# Patient Record
Sex: Male | Born: 1990 | Race: Black or African American | Hispanic: No | Marital: Married | Smoking: Never smoker
Health system: Southern US, Community
[De-identification: ages and names within clinical notes are randomized; demographics above are authoritative.]

## PROBLEM LIST (undated history)

## (undated) DIAGNOSIS — J45909 Unspecified asthma, uncomplicated: Secondary | ICD-10-CM

## (undated) DIAGNOSIS — M5387 Other specified dorsopathies, lumbosacral region: Secondary | ICD-10-CM

## (undated) DIAGNOSIS — D649 Anemia, unspecified: Secondary | ICD-10-CM

## (undated) HISTORY — DX: Other specified dorsopathies, lumbosacral region: M53.87

## (undated) HISTORY — DX: Anemia, unspecified: D64.9

---

## 2012-07-05 ENCOUNTER — Emergency Department (HOSPITAL_COMMUNITY)
Admission: EM | Admit: 2012-07-05 | Discharge: 2012-07-05 | Disposition: A | Discharge status: Home / Self Care | Payer: Self-pay | Attending: Emergency Medicine | Admitting: Emergency Medicine

## 2012-07-05 ENCOUNTER — Encounter (HOSPITAL_COMMUNITY): Payer: Self-pay | Admitting: Emergency Medicine

## 2012-07-05 DIAGNOSIS — K089 Disorder of teeth and supporting structures, unspecified: Secondary | ICD-10-CM | POA: Insufficient documentation

## 2012-07-05 DIAGNOSIS — K0889 Other specified disorders of teeth and supporting structures: Secondary | ICD-10-CM

## 2012-07-05 MED ORDER — HYDROCODONE-ACETAMINOPHEN 5-325 MG PO TABS
2.0000 | ORAL_TABLET | ORAL | Status: DC | PRN
Start: 2012-07-05 — End: 2018-08-18

## 2012-07-05 MED ORDER — PENICILLIN V POTASSIUM 500 MG PO TABS: 500.0000 mg | ORAL_TABLET | Freq: Three times a day (TID) | ORAL | Status: DC

## 2012-07-05 NOTE — ED Notes (Signed)
Pt reports abscess in mouth and tooth pain onset 2 days ago.

## 2012-07-05 NOTE — ED Notes (Signed)
Pt states that he has not been able to afford dental care; pt states pain in teeth started 2 days ago; pt alert and mentating appropriately; pt denies n/v/d. Pt denies numbness/tingling; pt is not tender to touch and no swelling noted to area.

## 2012-07-05 NOTE — ED Provider Notes (Signed)
History  This chart was scribed for Roxy Horseman, non-physician practitioner, working with Carleene Cooper III, MD by Bennett Scrape, ED Scribe. This patient was seen in room TR06C/TR06C and the patient's care was started at 5:40 PM.  CSN: [id number]  Arrival date & time 07/05/12  1544   First MD Initiated Contact with Patient 07/05/12 1740      Chief Complaint  Patient presents with  . Dental Pain     The history is provided by the patient. No language interpreter was used.    Ethan Gray is a 22 y.o. male who presents to the Emergency Department for chief complaint of 2 days of gradual onset, gradually worsening, constant right lower dental pain with associated dental abscess. He denies having a dentist to follow up with. He denies fevers, chills, nausea, emesis, neck pain, sore throat, visual disturbance, CP, cough, urinary symptoms, back pain, HA, weakness, numbness and rash as associated symptoms. He does not have a h/o chronic medical conditions and is an occasional alcohol user but denies smoking.  Mother reports that she may have Dr. Bobbye Charleston (Maxiofacial surgeon) information.  History reviewed. No pertinent past medical history.  History reviewed. No pertinent past surgical history.  No family history on file.  History  Substance Use Topics  . Smoking status: Never Smoker   . Smokeless tobacco: Not on file  . Alcohol Use: Yes      Review of Systems  A complete 10 system review of systems was obtained and all systems are negative except as noted in the HPI and PMH.   Allergies  Review of patient's allergies indicates no known allergies.  Home Medications  No current outpatient prescriptions on file.  Triage Vitals: BP 122/65  Pulse 68  Temp(Src) 98.7 F (37.1 C) (Oral)  Resp 18  SpO2 97%  Physical Exam  Nursing note and vitals reviewed. Constitutional: He is oriented to person, place, and time. He appears well-developed and well-nourished. No  distress.  HENT:  Head: Normocephalic and atraumatic.  Mouth/Throat:    No signs of gingiva abscess, no ludwig angina, no peritonsillar abscesses, uvula is midline, airway is intact  Eyes: Conjunctivae and EOM are normal.  Neck: Neck supple. No tracheal deviation present.  Cardiovascular: Normal rate.   Pulmonary/Chest: Effort normal. No respiratory distress.  Musculoskeletal: Normal range of motion.  Neurological: He is alert and oriented to person, place, and time.  Skin: Skin is warm and dry.  Psychiatric: He has a normal mood and affect. His behavior is normal.    ED Course  Procedures (including critical care time)  DIAGNOSTIC STUDIES: Oxygen Saturation is 97% on room air, adequate by my interpretation.    COORDINATION OF CARE: 5:50 PM- Discussed discharge plan which includes pain medication, antibiotic, salt water gargles and referral to a denitis with pt and pt agreed to plan. Also advised pt to call the dentist within 48 hours in order to have bill subsidized.  Periodontal Exam-accidentally added by scribe. Please ignore.  5:55 PM- Performed a alveloar dental block to the right lower tooth. Pt tolerated the procedure well.  Labs Reviewed - No data to display No results found.   1. Pain, dental       MDM  Uncomplicated dental pain.      I personally performed the services described in this documentation, which was scribed in my presence. The recorded information has been reviewed and is accurate.     Roxy Horseman, PA-C 07/06/12 0009

## 2012-07-05 NOTE — ED Notes (Signed)
Pt ambulatory leaving ED. Pt leaving with mother. Pt given d/c teaching and prescriptions. Pt given follow up care instructions; pt has no further questions upon d/c teaching. Pt alert and mentating appropriately. Pt leaving with d/c teaching and prescriptions. Pt does not show signs of acute distress upon d/c.

## 2012-07-06 NOTE — ED Provider Notes (Signed)
Medical screening examination/treatment/procedure(s) were performed by non-physician practitioner and as supervising physician I was immediately available for consultation/collaboration.   Carleene Cooper III, MD 07/06/12 [phone]

## 2017-05-29 ENCOUNTER — Ambulatory Visit: Payer: Medicaid Other | Admitting: Physical Therapy

## 2017-05-29 ENCOUNTER — Ambulatory Visit: Payer: Medicaid Other | Attending: Orthopaedic Surgery | Admitting: Physical Therapy

## 2017-05-29 ENCOUNTER — Encounter: Payer: Self-pay | Admitting: Physical Therapy

## 2017-05-29 DIAGNOSIS — G8929 Other chronic pain: Secondary | ICD-10-CM | POA: Diagnosis present

## 2017-05-29 DIAGNOSIS — M62830 Muscle spasm of back: Secondary | ICD-10-CM

## 2017-05-29 DIAGNOSIS — M5441 Lumbago with sciatica, right side: Secondary | ICD-10-CM | POA: Diagnosis present

## 2017-05-29 DIAGNOSIS — M5442 Lumbago with sciatica, left side: Secondary | ICD-10-CM | POA: Insufficient documentation

## 2017-05-30 ENCOUNTER — Ambulatory Visit: Payer: Medicaid Other | Admitting: Physical Therapy

## 2017-05-30 NOTE — Therapy (Addendum)
[place], [place], [address] Phone: [phone]   Fax:  [phone]  Physical Therapy Evaluation/Discharge   Patient Details  Name: Ethan Gray MRN: [id number] Date of Birth: 05-06-1991 Referring Provider: Dr Melrose Nakayama    Encounter Date: 05/29/2017  PT End of Session - 05/30/17 0812    Visit Number  1    Number of Visits  4    Date for PT Re-Evaluation  07/04/17    Authorization Type  3 visits per medicaid then plan for 8 more if needed     PT Start Time  1145    PT Stop Time  1238    PT Time Calculation (min)  53 min    Equipment Utilized During Treatment  Gait belt    Activity Tolerance  Patient tolerated treatment well       History reviewed. No pertinent past medical history.  History reviewed. No pertinent surgical history.  There were no vitals filed for this visit.   Subjective Assessment - 05/29/17 1151    Subjective  Patient woke up with lower back pain about a month ago. He had some back pain in the past after lifting a lawn mower. He was able to improve the painon his won the last time. This time the pain wont go away.     Limitations  Standing;Walking    How long can you sit comfortably?  < 30 min before he startes to stiffen up.     How long can you stand comfortably?  Around 30 minutes     How long can you walk comfortably?  walking dosent increase pain     Diagnostic tests  x-ray: per patient flattening of the lumbar lordosis     Currently in Pain?  Yes    Pain Score  6     Pain Location  Back    Pain Orientation  Right;Left;Mid;Lower    Pain Descriptors / Indicators  Aching    Pain Radiating Towards  radiates down both legs to his feet     Aggravating Factors   Any prolonged positioning     Pain Relieving Factors  Lying for a cetain amount of time.     Effect of Pain on Daily Activities  Difficulty perfromign work tasks     Multiple Pain Sites  No         [facility] PT  Assessment - 05/30/17 0001      Assessment   Medical Diagnosis  Low back pain     Referring Provider  Dr Melrose Nakayama     Onset Date/Surgical Date  -- 1 month prior     Next MD Visit  this afternoon     Prior Therapy  None       Precautions   Precautions  None      Restrictions   Weight Bearing Restrictions  No      Balance Screen   Has the patient fallen in the past 6 months  No    Has the patient had a decrease in activity level because of a fear of falling?   No    Is the patient reluctant to leave their home because of a fear of falling?   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     Leisure  Playing with his kids       Cognition   Overall Cognitive Status  Within Functional Limits for tasks assessed    Attention  Focused    Focused Attention  Appears intact    Memory  Appears intact    Awareness  Appears intact    Problem Solving  Appears intact      Observation/Other Assessments   Focus on Therapeutic Outcomes (FOTO)   Medicaid       Sensation   Additional Comments  Numbness into the bilateral lower extrmeitys       Coordination   Gross Motor Movements are Fluid and Coordinated  Yes    Fine Motor Movements are Fluid and Coordinated  Yes      Posture/Postural Control   Posture/Postural Control  No significant limitations      AROM   Lumbar Flexion  limited 75% with pain     Lumbar Extension  limited 25% with pain     Lumbar - Right Side Bend  limited 50% with pain     Lumbar - Left Side Bend  limited 25% with pain     Lumbar - Right Rotation  limited 50% with pain     Lumbar - Left Rotation  limited 25% with pain.       Strength   Overall Strength Comments  gross bilateral lower extremity strength 5/5; core contraction fair       Flexibility   Soft Tissue Assessment /Muscle  Length  yes    Hamstrings  55 left 60 degrees right       Palpation   Palpation comment  spasming of bilateral paraspinals into upper gluts       Special Tests   Other special tests  Straight leg raise: (+) bilateral       Ambulation/Gait   Gait Comments  normal gait              Objective measurements completed on examination: See above findings.      [facility] Adult PT Treatment/Exercise - 05/30/17 0001      Lumbar Exercises: Stretches   Passive Hamstring Stretch Limitations  reviewed seated and supine at 90/90 2x20 sec each leg    Single Knee to Chest Stretch Limitations  2x20 sec hold     Piriformis Stretch Limitations  2x20 sec hold       Lumbar Exercises: Standing   Other Standing Lumbar Exercises  standing tennis ball trigger point release       Lumbar Exercises: Supine   AB Set Limitations  reviewed abdominal breathing     Bent Knee Raise Limitations  x10 each leg     Bridge Limitations  10              PT Education - 05/29/17 1341    Education provided  Yes  (Pended)        PT Short Term Goals - 05/30/17 0828      PT SHORT TERM GOAL #1   Title  Patient will demsitrate a good core contraction     Baseline  fair core contraction     Time  8    Period  Weeks    Status  New    Target Date  06/27/17      PT SHORT TERM GOAL #2   Title  Patient will increase 90/90 hamstring length by 20 degrees bilateral     Baseline  55 degrees left 60 degrees left     Time  4    Period  Weeks    Status  New    Target Date  06/27/17      PT SHORT TERM GOAL #3   Title  Pt will be independent with intial HEP     Baseline  No HEP     Time  4    Period  Weeks    Status  New    Target Date  06/27/17        PT Long Term Goals - 05/30/17 0833      PT LONG TERM GOAL #1   Title  Patient will pick up 25lb box with proper mechanics     Baseline  difficulty bending and difficulty picking up any weight without increased radicualr signs.    Time  8    Period   Weeks    Status  New    Target Date  07/25/17      PT LONG TERM GOAL #2   Title  Patient will increase lumbar rotation to full without pain to the right     Baseline  50% limited to the right without pain     Time  8    Period  Weeks    Status  New    Target Date  07/25/17      PT LONG TERM GOAL #3   Title  Patient will report no pain in his back with ADL's     Baseline  Pain levels can reach an 8/10     Time  8    Period  Weeks    Status  New    Target Date  07/25/17             Plan - 05/30/17 0813    Clinical Impression Statement  Patient is a 27 year old male with lower back pain that radiates into both legs. He has pain bending forward and a (+) SLR. Signs and symptoms are consitent wih lumbar disc buldge. He has tight hamstrings and has to do a lot of liftign at work. He would benefit from skilled therapy to reduce infalamtion and radicular symptoms, improve muscle length; and to work on his lfiting technique     Clinical Presentation  Evolving    Clinical Presentation due to:  radicular pain radiating into bilateral lower extremitys     Clinical Decision Making  Low    Rehab Potential  Good    PT Frequency  1x / week    PT Duration  4 weeks    PT Treatment/Interventions  ADLs/Self Care Home Management;Cryotherapy;Electrical Stimulation;Ultrasound;Iontophoresis 59m/ml Dexamethasone;Gait training;Stair training;Therapeutic activities;Therapeutic exercise;Neuromuscular re-education;Patient/family education;Manual techniques;Passive range of motion;Dry needling;Taping    PT Next Visit Plan  cosider looking at how he does in prone. Review stretches; review lifting technique; consdier double knee to ches tiwht ball; consider clamshell; TPDN if needed.     PT Home Exercise Plan  bridges; supine march with abdominal breathing; single knee to chest stretch; piriformis stretch; hamstring stretch from seted and supine     Consulted and Agree with Plan of Care  Patient        Patient will benefit from skilled therapeutic intervention in order to improve the following deficits and impairments:     Visit Diagnosis: Chronic bilateral low back pain with bilateral sciatica  Muscle spasm of back   PHYSICAL THERAPY DISCHARGE SUMMARY  Visits from Start of Care: 1 Current functional level  related to goals / functional outcomes: Did not return for follow up    Remaining deficits: Unknown    Education / Equipment: Unknown   Plan: Patient agrees to discharge.  Patient goals were not met. Patient is being discharged due to meeting the stated rehab goals.  ?????       Problem List There are no active problems to display for this patient.   Carney Living PT DPT  05/30/2017, 9:02 AM   .  [facility] [address] [place], [place], [address] Phone: [phone]   Fax:  [phone]  Name: Bassel Gaskill MRN: [id number] Date of Birth: Dec 14, 1990

## 2017-05-31 ENCOUNTER — Emergency Department (HOSPITAL_COMMUNITY)
Admission: EM | Admit: 2017-05-31 | Discharge: 2017-05-31 | Disposition: A | Discharge status: Home / Self Care | Payer: Medicaid Other | Attending: Emergency Medicine | Admitting: Emergency Medicine

## 2017-05-31 ENCOUNTER — Encounter (HOSPITAL_COMMUNITY): Payer: Self-pay

## 2017-05-31 ENCOUNTER — Other Ambulatory Visit: Payer: Self-pay

## 2017-05-31 DIAGNOSIS — G8929 Other chronic pain: Secondary | ICD-10-CM

## 2017-05-31 DIAGNOSIS — M5442 Lumbago with sciatica, left side: Secondary | ICD-10-CM | POA: Insufficient documentation

## 2017-05-31 DIAGNOSIS — M545 Low back pain: Secondary | ICD-10-CM | POA: Diagnosis present

## 2017-05-31 DIAGNOSIS — M62830 Muscle spasm of back: Secondary | ICD-10-CM | POA: Insufficient documentation

## 2017-05-31 DIAGNOSIS — M5441 Lumbago with sciatica, right side: Secondary | ICD-10-CM | POA: Insufficient documentation

## 2017-05-31 MED ORDER — CYCLOBENZAPRINE HCL 10 MG PO TABS: 10.0000 mg | ORAL_TABLET | Freq: Three times a day (TID) | ORAL | 0 refills | Status: DC | PRN

## 2017-05-31 MED ORDER — MELOXICAM 15 MG PO TABS: 15.0000 mg | ORAL_TABLET | Freq: Every day | ORAL | 0 refills | Status: DC

## 2017-05-31 NOTE — Discharge Instructions (Signed)
Use mobic daily as directed, don't use additional NSAIDs like ibuprofen/aleve/etc while taking mobic. Use additional tylenol as needed for additional pain relief. Use flexeril as directed as needed for muscle relaxation. Do not drive or operate machinery with muscle relaxant use. Use heat to areas of soreness, no more than 20 minutes at a time every hour. Avoid heavy lifting or bending/twisting. Follow up with your orthopedist in 1 week for ongoing management of your back pain. Return to ER for emergent changing or worsening of symptoms.

## 2017-05-31 NOTE — ED Provider Notes (Signed)
[facility]-EMERGENCY DEPT Provider Note   CSN: [id number] Arrival date & time: 05/31/17  0818     History   Chief Complaint Chief Complaint  Patient presents with  . Back Pain    HPI Ethan Gray is a 27 y.o. male with a PMHx of chronic low back pain and sciatica, who presents to the ED with complaints of ongoing lower back pain x1 month since bending over at work after lifting something heavy and feeling/hearing a "pop" in his back.  He states that he was seen by Dr. Jerl Santos at [facility] and has an MRI scheduled for Wednesday (5 days from now), but when they called him this morning telling him he had come to the ER, the office told them to "stay here for the MRI" today.  He describes his pain as 7/10 constant sharp low back pain worse on the right, radiating down both legs but worse on the right, worse with standing, sitting, and walking, and unrelieved with Tylenol, Medrol pack, and tizanidine.  He denies fevers, chills, CP, SOB, abd pain, N/V/D/C, hematuria, dysuria, incontinence of urine/stool, saddle anesthesia/cauda equina symptoms, myalgias, arthralgias, numbness, tingling, focal weakness, or any other complaints at this time.  He denies hx of CA or IVDU.  Of note, chart review reveals that he was seen by PT on 05/29/17 for this issue.    The history is provided by the patient and medical records. No language interpreter was used.  Back Pain   This is a recurrent problem. The current episode started more than 1 week ago. The problem occurs constantly. The problem has not changed since onset.The pain is associated with lifting heavy objects. The pain is present in the lumbar spine. Quality: sharp. The pain radiates to the left thigh and right thigh. The pain is at a severity of 7/10. The pain is moderate. Exacerbated by: standing, sitting, walking, etc. The pain is the same all the time. Pertinent negatives include no chest pain, no fever, no numbness, no  abdominal pain, no bowel incontinence, no perianal numbness, no bladder incontinence, no dysuria, no paresthesias, no paresis, no tingling and no weakness. He has tried muscle relaxants (tizanidine, medrol pack, and tylenol) for the symptoms. The treatment provided no relief.    History reviewed. No pertinent past medical history.  There are no active problems to display for this patient.   History reviewed. No pertinent surgical history.     Home Medications    Prior to Admission medications   Medication Sig Start Date End Date Taking? Authorizing Provider  HYDROcodone-acetaminophen (NORCO/VICODIN) 5-325 MG per tablet Take 2 tablets by mouth every 4 (four) hours as needed for pain. Patient not taking: Reported on 05/29/2017 07/05/12   Roxy Horseman, PA-C  penicillin v potassium (VEETID) 500 MG tablet Take 1 tablet (500 mg total) by mouth 3 (three) times daily. Patient not taking: Reported on 05/29/2017 07/05/12   Roxy Horseman, PA-C    Family History Family History  Problem Relation Age of Onset  . Asthma Father     Social History Social History   Tobacco Use  . Smoking status: Never Smoker  . Smokeless tobacco: Never Used  Substance Use Topics  . Alcohol use: No    Frequency: Never  . 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   Review of Systems Review of Systems  Constitutional: Negative for chills  and fever.  Respiratory: Negative for shortness of breath.   Cardiovascular: Negative for chest pain.  Gastrointestinal: Negative for abdominal pain, bowel incontinence, constipation, diarrhea, nausea and vomiting.  Genitourinary: Negative for bladder incontinence, difficulty urinating (no incontinence), dysuria and hematuria.  Musculoskeletal: Positive for back pain. Negative for arthralgias and myalgias.  Skin: Negative for color change.  Allergic/Immunologic: Negative for immunocompromised state.    Neurological: Negative for tingling, weakness, numbness and paresthesias.  Psychiatric/Behavioral: Negative for confusion.   All other systems reviewed and are negative for acute change except as noted in the HPI.    Physical Exam Updated Vital Signs BP 128/71 (BP Location: Left Arm)   Pulse 61   Temp 98.4 F (36.9 C) (Oral)   Resp 17   Ht 5\' 11"  (1.803 m)   Wt 72.6 kg (160 lb)   SpO2 100%   BMI 22.32 kg/m   Physical Exam  Constitutional: He is oriented to person, place, and time. Vital signs are normal. He appears well-developed and well-nourished.  Non-toxic appearance. No distress.  Afebrile, nontoxic, NAD  HENT:  Head: Normocephalic and atraumatic.  Mouth/Throat: Mucous membranes are normal.  Eyes: Conjunctivae and EOM are normal. Right eye exhibits no discharge. Left eye exhibits no discharge.  Neck: Normal range of motion. Neck supple.  Cardiovascular: Normal rate and intact distal pulses.  Pulmonary/Chest: Effort normal. No respiratory distress.  Abdominal: Normal appearance. He exhibits no distension.  Musculoskeletal: Normal range of motion.       Lumbar back: He exhibits tenderness and spasm. He exhibits normal range of motion, no bony tenderness and no deformity.       Back:  Lumbar spine with FROM intact without spinous process TTP, no bony stepoffs or deformities, with mild b/l paraspinous muscle TTP and muscle spasms. Strength and sensation grossly intact in all extremities, +SLR bilaterally, gait steady and nonantalgic. No overlying skin changes. Distal pulses intact.   Neurological: He is alert and oriented to person, place, and time. He has normal strength. No sensory deficit.  Skin: Skin is warm, dry and intact. No rash noted.  Psychiatric: He has a normal mood and affect.  Nursing note and vitals reviewed.    ED Treatments / Results  Labs (all labs ordered are listed, but only abnormal results are displayed) Labs Reviewed - No data to display  EKG   EKG Interpretation None       Radiology No results found.  Procedures Procedures (including critical care time)  Medications Ordered in ED Medications - No data to display   Initial Impression / Assessment and Plan / ED Course  I have reviewed the triage vital signs and the nursing notes.  Pertinent labs & imaging results that were available during my care of the patient were reviewed by me and considered in my medical decision making (see chart for details).     27 y.o. male here with c/o back pain x1 month, was already seen by orthopedist and is scheduled for MRI on Wednesday, but was told to "stay here for the MRI" today. On exam, diffuse b/l paraspinous muscle TTP and spasms in lower lumbar region; no focal midline spinal tenderness. No red flag s/s of low back pain. No s/s of central cord compression or cauda equina. Lower extremities are neurovascularly intact and patient is ambulating without difficulty. No urinary complaints. Doubt need for emergent imaging/labs, discussed that it could be muscle strain vs disc pathology, but no emergent condition appears to be present, therefore emergent MRI  is not indicated at this time. Advised calling his orthopedist to see if the MRI could be moved up, or just wait til Wednesday (5 days from now) for his already scheduled MRI.   Patient was counseled on back pain precautions and told to do activity as tolerated but do not lift, push, or pull heavy objects more than 10 pounds for the next week. Patient counseled to use ice or heat on back for no longer than 15 minutes every hour.   Rx given for different muscle relaxer than he was previously given, and counseled on proper use of muscle relaxant medication. Urged patient not to drink alcohol, drive, or perform any other activities that requires focus while taking these medications. Rx for mobic given. Advised tylenol use as well. Will hold off on steroids again since he just completed this  recently.   Patient urged to follow-up with his orthopedist in 1wk for recheck, or sooner if pain does not improve with treatment and rest or if pain becomes recurrent. Urged to return with worsening severe pain, loss of bowel or bladder control, trouble walking. The patient verbalizes understanding and agrees with the plan.    Final Clinical Impressions(s) / ED Diagnoses   Final diagnoses:  Chronic bilateral low back pain with bilateral sciatica  Muscle spasm of back    ED Discharge Orders        Ordered    meloxicam (MOBIC) 15 MG tablet  Daily     05/31/17 1423    cyclobenzaprine (FLEXERIL) 10 MG tablet  3 times daily PRN     05/31/17 [address] [place], [place] 05/31/17 1431    Lorre Nick, MD 06/01/17 [phone]

## 2017-05-31 NOTE — ED Triage Notes (Signed)
Patient c/o right -sided low back pain, but states it is usually both sides. Patient reports pain radiates down the right leg. Back pain and right leg pain x 1 month.

## 2017-06-11 ENCOUNTER — Ambulatory Visit: Payer: Medicaid Other | Admitting: Physical Therapy

## 2017-06-12 ENCOUNTER — Telehealth: Payer: Self-pay | Admitting: Physical Therapy

## 2017-06-12 NOTE — Telephone Encounter (Signed)
Left message regarding no-show to appointment yesterday. Reminded patient of next appointment time and asked him to call if he cannot attend.

## 2017-06-17 ENCOUNTER — Ambulatory Visit: Payer: Medicaid Other | Attending: Orthopaedic Surgery | Admitting: Physical Therapy

## 2017-06-18 ENCOUNTER — Encounter: Payer: Medicaid Other | Admitting: Physical Therapy

## 2017-06-24 ENCOUNTER — Encounter: Payer: Medicaid Other | Admitting: Physical Therapy

## 2018-08-18 ENCOUNTER — Encounter (HOSPITAL_COMMUNITY): Payer: Self-pay

## 2018-08-18 ENCOUNTER — Other Ambulatory Visit: Payer: Self-pay

## 2018-08-18 ENCOUNTER — Ambulatory Visit (HOSPITAL_COMMUNITY)
Admission: EM | Admit: 2018-08-18 | Discharge: 2018-08-18 | Disposition: A | Discharge status: Home / Self Care | Payer: Medicaid Other | Attending: Emergency Medicine | Admitting: Emergency Medicine

## 2018-08-18 DIAGNOSIS — S61431A Puncture wound without foreign body of right hand, initial encounter: Secondary | ICD-10-CM

## 2018-08-18 DIAGNOSIS — W269XXA Contact with unspecified sharp object(s), initial encounter: Secondary | ICD-10-CM | POA: Diagnosis not present

## 2018-08-18 DIAGNOSIS — Z23 Encounter for immunization: Secondary | ICD-10-CM | POA: Diagnosis not present

## 2018-08-18 MED ORDER — TETANUS-DIPHTH-ACELL PERTUSSIS 5-2.5-18.5 LF-MCG/0.5 IM SUSP
INTRAMUSCULAR | Status: AC
Start: 2018-08-18 — End: ?
  Filled 2018-08-18: qty 0.5

## 2018-08-18 MED ORDER — CEPHALEXIN 500 MG PO CAPS
500.0000 mg | ORAL_CAPSULE | Freq: Four times a day (QID) | ORAL | 0 refills | Status: DC
Start: 2018-08-18 — End: 2018-09-15

## 2018-08-18 MED ORDER — TETANUS-DIPHTH-ACELL PERTUSSIS 5-2.5-18.5 LF-MCG/0.5 IM SUSP
0.5000 mL | Freq: Once | INTRAMUSCULAR | Status: AC
  Administered 2018-08-18: 0.5 mL via INTRAMUSCULAR

## 2018-08-18 NOTE — ED Provider Notes (Signed)
[facility]    CSN: [id number] Arrival date & time: 08/18/18  2297     History   Chief Complaint Chief Complaint  Patient presents with  . Appointment  . Hand Pain    HPI Ethan Gray is a 28 y.o. male.   28 year old male comes in for 3-day history of right hand pain.  States he was working on tin roof, and had a puncture wound to the dorsal aspect of the right thumb.  States pain has slowly increased in the past 3 days, and for the past 2 days has had erythema around the wound with increased swelling and decreased range of motion.  He denies numbness, tingling.  Denies fever, chills, night sweats.  Has not taken any medication for the symptoms.  Last tetanus 2013.     History reviewed. No pertinent past medical history.  There are no active problems to display for this patient.   History reviewed. No pertinent surgical history.     Home Medications    Prior to Admission medications   Medication Sig Start Date End Date Taking? Authorizing Provider  cephALEXin (KEFLEX) 500 MG capsule Take 1 capsule (500 mg total) by mouth 4 (four) times daily. 08/18/18   Cathie Hoops, Dayvin Aber V, PA-C  cyclobenzaprine (FLEXERIL) 10 MG tablet Take 1 tablet (10 mg total) by mouth 3 (three) times daily as needed for muscle spasms. 05/31/17   Street, [place], PA-C  meloxicam (MOBIC) 15 MG tablet Take 1 tablet (15 mg total) by mouth daily. TAKE WITH MEALS 05/31/17   Street, [place], [place]    Family History Family History  Problem Relation Age of Onset  . Asthma Father     Social History Social History   Tobacco Use  . Smoking status: Never Smoker  . Smokeless tobacco: Never Used  Substance Use Topics  . Alcohol use: No    Frequency: Never  . 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   Review of Systems Review of Systems  Reason unable to perform ROS: See HPI as above.     Physical Exam Triage Vital Signs ED  Triage Vitals  Enc Vitals Group     BP 08/18/18 0939 119/78     Pulse Rate 08/18/18 0939 95     Resp 08/18/18 0939 17     Temp 08/18/18 0939 98.4 F (36.9 C)     Temp Source 08/18/18 0939 Oral     SpO2 08/18/18 0939 98 %     Weight --      Height --      Head Circumference --      Peak Flow --      Pain Score 08/18/18 0937 3     Pain Loc --      Pain Edu? --      Excl. in GC? --    No data found.  Updated Vital Signs BP 119/78 (BP Location: Right Arm)   Pulse 95   Temp 98.4 F (36.9 C) (Oral)   Resp 17   SpO2 98%   Physical Exam Constitutional:      General: He is not in acute distress.    Appearance: He is well-developed. He is not diaphoretic.  HENT:     Head: Normocephalic and atraumatic.  Eyes:     Conjunctiva/sclera: Conjunctivae normal.     Pupils: Pupils are equal, round, and reactive to light.  Musculoskeletal:     Comments: Puncture wound noted to the dorsal first MCP area. Swelling with erythema noted. Tenderness to palpation to puncture wound. Decreased flexion of the first MCP joint. Strength normal and equal bilaterally. Sensation intact and equal bilaterally. Radial pulse 2+, cap refill <2s  Neurological:     Mental Status: He is alert and oriented to person, place, and time.      UC Treatments / Results  Labs (all labs ordered are listed, but only abnormal results are displayed) Labs Reviewed - No data to display  EKG None  Radiology No results found.  Procedures Procedures (including critical care time)  Medications Ordered in UC Medications  Tdap (BOOSTRIX) injection 0.5 mL (0.5 mLs Intramuscular Given 08/18/18 0955)    Initial Impression / Assessment and Plan / UC Course  I have reviewed the triage vital signs and the nursing notes.  Pertinent labs & imaging results that were available during my care of the patient were reviewed by me and considered in my medical decision making (see chart for details).    Start Keflex as directed.   Tetanus updated.  Wound care instructions provided.  Return precautions given.  Patient expresses understanding and agrees to plan.  Final Clinical Impressions(s) / UC Diagnoses   Final diagnoses:  Puncture wound of right hand without foreign body, initial encounter    ED Prescriptions    Medication Sig Dispense Auth. Provider   cephALEXin (KEFLEX) 500 MG capsule Take 1 capsule (500 mg total) by mouth 4 (four) times daily. 28 capsule Threasa Alpha, [place] 08/18/18 1111

## 2018-08-18 NOTE — Discharge Instructions (Signed)
Tetanus updated today.  Start Keflex as directed.  Warm compress to the area.  Monitor for spreading redness, increased warmth, increased pain, fever, follow-up for reevaluation needed.

## 2018-08-18 NOTE — ED Triage Notes (Signed)
Patient presents to Urgent Care with complaints of right hand pain since 3 days ago. Patient states he cut his hand on a piece of tin, thinks it may be infected, and would like a tetanus shot- last had in 2013.

## 2018-08-18 NOTE — ED Notes (Signed)
Patient verbalizes understanding of discharge instructions. Opportunity for questioning and answers were provided. Patient discharged from [facility] by RN.  

## 2018-09-15 ENCOUNTER — Ambulatory Visit (HOSPITAL_COMMUNITY)
Admission: EM | Admit: 2018-09-15 | Discharge: 2018-09-15 | Disposition: A | Discharge status: Home / Self Care | Payer: Medicaid Other | Attending: Family Medicine | Admitting: Family Medicine

## 2018-09-15 ENCOUNTER — Encounter (HOSPITAL_COMMUNITY): Payer: Self-pay

## 2018-09-15 ENCOUNTER — Other Ambulatory Visit: Payer: Self-pay

## 2018-09-15 DIAGNOSIS — B349 Viral infection, unspecified: Secondary | ICD-10-CM | POA: Diagnosis not present

## 2018-09-15 NOTE — ED Triage Notes (Signed)
Patient presents to Urgent Care with complaints of headaches and weakness since a week ago. Patient reports he has been taking aspirin with relief. Pt denies fever.

## 2018-09-15 NOTE — Discharge Instructions (Addendum)
I believe that you have a viral illness causing your body aches headache. It is impossible for me to tell whether this is coronavirus, but I think the safest approach is to assume that it is You need to stay home for a week after symptoms Need to drink lots of fluids Try to limit your smoking Take Tylenol as needed for any pain or fever.  Avoid ibuprofen and Aleve If your symptoms worsen significantly, increasing fever weakness or shortness of breath, you need to go to the emergency room     Person Under Monitoring Name: Ethan BullaJonathon Gray  Location[place] [address] [place] [id number]   Infection Prevention Recommendations for Individuals Confirmed to have, or Being Evaluated for, 2019 Novel Coronavirus (COVID-19) Infection Who Receive Care at Home  Individuals who are confirmed to have, or are being evaluated for, COVID-19 should follow the prevention steps below until a healthcare provider or local or state health department says they can return to normal activities.  Stay home except to get medical care You should restrict activities outside your home, except for getting medical care. Do not go to work, school, or public areas, and do not use public transportation or taxis.  Call ahead before visiting your doctor Before your medical appointment, call the healthcare provider and tell them that you have, or are being evaluated for, COVID-19 infection. This will help the healthcare providers office take steps to keep other people from getting infected. Ask your healthcare provider to call the local or state health department.  Monitor your symptoms Seek prompt medical attention if your illness is worsening (e.g., difficulty breathing). Before going to your medical appointment, call the healthcare provider and tell them that you have, or are being evaluated for, COVID-19 infection. Ask your healthcare provider to call the local or state health department.  Wear a facemask You  should wear a facemask that covers your nose and mouth when you are in the same room with other people and when you visit a healthcare provider. People who live with or visit you should also wear a facemask while they are in the same room with you.  Separate yourself from other people in your home As much as possible, you should stay in a different room from other people in your home. Also, you should use a separate bathroom, if available.  Avoid sharing household items You should not share dishes, drinking glasses, cups, eating utensils, towels, bedding, or other items with other people in your home. After using these items, you should wash them thoroughly with soap and water.  Cover your coughs and sneezes Cover your mouth and nose with a tissue when you cough or sneeze, or you can cough or sneeze into your sleeve. Throw used tissues in a lined trash can, and immediately wash your hands with soap and water for at least 20 seconds or use an alcohol-based hand rub.  Wash your [employer]hands Wash your hands often and thoroughly with soap and water for at least 20 seconds. You can use an alcohol-based hand sanitizer if soap and water are not available and if your hands are not visibly dirty. Avoid touching your eyes, nose, and mouth with unwashed hands.   Prevention Steps for Caregivers and Household Members of Individuals Confirmed to have, or Being Evaluated for, COVID-19 Infection Being Cared for in the Home  If you live with, or provide care at home for, a person confirmed to have, or being evaluated for, COVID-19 infection please follow these guidelines  to prevent infection:  Follow healthcare providers instructions Make sure that you understand and can help the patient follow any healthcare provider instructions for all care.  Provide for the patients basic needs You should help the patient with basic needs in the home and provide support for getting groceries, prescriptions, and other  personal needs.  Monitor the patients symptoms If they are getting sicker, call his or her medical provider and tell them that the patient has, or is being evaluated for, COVID-19 infection. This will help the healthcare providers office take steps to keep other people from getting infected. Ask the healthcare provider to call the local or state health department.  Limit the number of people who have contact with the patient If possible, have only one caregiver for the patient. Other household members should stay in another home or place of residence. If this is not possible, they should stay in another room, or be separated from the patient as much as possible. Use a separate bathroom, if available. Restrict visitors who do not have an essential need to be in the home.  Keep older adults, very young children, and other sick people away from the patient Keep older adults, very young children, and those who have compromised immune systems or chronic health conditions away from the patient. This includes people with chronic heart, lung, or kidney conditions, diabetes, and cancer.  Ensure good ventilation Make sure that shared spaces in the home have good air flow, such as from an air conditioner or an opened window, weather permitting.  Wash your hands often Wash your hands often and thoroughly with soap and water for at least 20 seconds. You can use an alcohol based hand sanitizer if soap and water are not available and if your hands are not visibly dirty. Avoid touching your eyes, nose, and mouth with unwashed hands. Use disposable paper towels to dry your hands. If not available, use dedicated cloth towels and replace them when they become wet.  Wear a facemask and gloves Wear a disposable facemask at all times in the room and gloves when you touch or have contact with the patients blood, body fluids, and/or secretions or excretions, such as sweat, saliva, sputum, nasal mucus, vomit,  urine, or feces.  Ensure the mask fits over your nose and mouth tightly, and do not touch it during use. Throw out disposable facemasks and gloves after using them. Do not reuse. Wash your hands immediately after removing your facemask and gloves. If your personal clothing becomes contaminated, carefully remove clothing and launder. Wash your hands after handling contaminated clothing. Place all used disposable facemasks, gloves, and other waste in a lined container before disposing them with other household waste. Remove gloves and wash your hands immediately after handling these items.  Do not share dishes, glasses, or other household items with the patient Avoid sharing household items. You should not share dishes, drinking glasses, cups, eating utensils, towels, bedding, or other items with a patient who is confirmed to have, or being evaluated for, COVID-19 infection. After the person uses these items, you should wash them thoroughly with soap and water.  Wash laundry thoroughly Immediately remove and wash clothes or bedding that have blood, body fluids, and/or secretions or excretions, such as sweat, saliva, sputum, nasal mucus, vomit, urine, or feces, on them. Wear gloves when handling laundry from the patient. Read and follow directions on labels of laundry or clothing items and detergent. In general, wash and dry with the warmest temperatures recommended  on the label.  Clean all areas the individual has used often Clean all touchable surfaces, such as counters, tabletops, doorknobs, bathroom fixtures, toilets, phones, keyboards, tablets, and bedside tables, every day. Also, clean any surfaces that may have blood, body fluids, and/or secretions or excretions on them. Wear gloves when cleaning surfaces the patient has come in contact with. Use a diluted bleach solution (e.g., dilute bleach with 1 part bleach and 10 parts water) or a household disinfectant with a label that says  EPA-registered for coronaviruses. To make a bleach solution at home, add 1 tablespoon of bleach to 1 quart (4 cups) of water. For a larger supply, add  cup of bleach to 1 gallon (16 cups) of water. Read labels of cleaning products and follow recommendations provided on product labels. Labels contain instructions for safe and effective use of the cleaning product including precautions you should take when applying the product, such as wearing gloves or eye protection and making sure you have good ventilation during use of the product. Remove gloves and wash hands immediately after cleaning.  Monitor yourself for signs and symptoms of illness Caregivers and household members are considered close contacts, should monitor their health, and will be asked to limit movement outside of the home to the extent possible. Follow the monitoring steps for close contacts listed on the symptom monitoring form.   ? If you have additional questions, contact your local health department or call the epidemiologist on call at [phone] (available 24/7). ? This guidance is subject to change. For the most up-to-date guidance from [facility], please refer to their website: [email or web address]

## 2018-09-15 NOTE — ED Provider Notes (Signed)
[facility]    CSN: [id number] Arrival date & time: 09/15/18  1327     History   Chief Complaint Chief Complaint  Patient presents with  . Weakness  . Headache    HPI Ethan Gray is a 28 y.o. male.   HPI  Patient is here for an illness.  He has had headaches and weakness and body aches for a week.  He thinks he has had some fever but did not take his temperature.  No runny stuffy nose or sore throat or ear pain.  No chest congestion or coughing.  He does have a history of asthma.  He is a smoker.  He states he has his "normal" amount of coughing.  He has had a decreased appetite, mild.  Drinking fluids.  No problems with bowels or digestion.  No urinary complaint. No known specific exposure to COVID-19  History reviewed. No pertinent past medical history.  There are no active problems to display for this patient.   History reviewed. No pertinent surgical history.     Home Medications    Prior to Admission medications   Not on File    Family History Family History  Problem Relation Age of Onset  . Asthma Father     Social History Social History   Tobacco Use  . Smoking status: Never Smoker  . Smokeless tobacco: Never Used  Substance Use Topics  . Alcohol use: No    Frequency: Never  . 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   Review of Systems Review of Systems  Constitutional: Positive for appetite change and fever. Negative for chills.  HENT: Negative for congestion, ear pain and sore throat.   Eyes: Negative for pain and visual disturbance.  Respiratory: Negative for cough and shortness of breath.   Cardiovascular: Negative for chest pain and palpitations.  Gastrointestinal: Negative for abdominal pain and vomiting.  Genitourinary: Negative for dysuria and hematuria.  Musculoskeletal: Positive for myalgias. Negative for arthralgias and back pain.  Skin: Negative  for color change and rash.  Neurological: Positive for weakness and headaches. Negative for seizures and syncope.  All other systems reviewed and are negative.    Physical Exam Triage Vital Signs ED Triage Vitals  Enc Vitals Group     BP 09/15/18 1342 111/61     Pulse Rate 09/15/18 1342 82     Resp 09/15/18 1342 18     Temp 09/15/18 1342 98.8 F (37.1 C)     Temp Source 09/15/18 1342 Oral     SpO2 09/15/18 1342 98 %     Weight --      Height --      Head Circumference --      Peak Flow --      Pain Score 09/15/18 1341 8     Pain Loc --      Pain Edu? --      Excl. in GC? --    No data found.  Updated Vital Signs BP 111/61 (BP Location: Left Arm)   Pulse 82   Temp 98.8 F (37.1 C) (Oral)   Resp 18   SpO2 98%    Physical Exam Constitutional:      General: He is not in acute distress.    Appearance: He is well-developed and normal weight. He is ill-appearing.     Comments: Appears tired  HENT:  Head: Normocephalic and atraumatic.     Mouth/Throat:     Mouth: Mucous membranes are moist.  Eyes:     Extraocular Movements: Extraocular movements intact.     Conjunctiva/sclera: Conjunctivae normal.     Pupils: Pupils are equal, round, and reactive to light.  Neck:     Musculoskeletal: Normal range of motion and neck supple.  Cardiovascular:     Rate and Rhythm: Normal rate and regular rhythm.     Heart sounds: Normal heart sounds.  Pulmonary:     Effort: Pulmonary effort is normal. No respiratory distress.     Breath sounds: Rhonchi present.     Comments: Anterior rhonchi.  No wheeze or rales Abdominal:     General: Bowel sounds are normal. There is no distension.     Palpations: Abdomen is soft.  Musculoskeletal: Normal range of motion.        General: No swelling.  Lymphadenopathy:     Cervical: No cervical adenopathy.  Skin:    General: Skin is warm and dry.  Neurological:     General: No focal deficit present.     Mental Status: He is alert and  oriented to person, place, and time.  Psychiatric:        Mood and Affect: Mood normal.      UC Treatments / Results  Labs (all labs ordered are listed, but only abnormal results are displayed) Labs Reviewed - No data to display  EKG None  Radiology No results found.  Procedures Procedures (including critical care time)  Medications Ordered in UC Medications - No data to display  Initial Impression / Assessment and Plan / UC Course  I have reviewed the triage vital signs and the nursing notes.  Pertinent labs & imaging results that were available during my care of the patient were reviewed by me and considered in my medical decision making (see chart for details).     I reviewed that the patient likely has a viral illness.  Impossible to tell which virus. Final Clinical Impressions(s) / UC Diagnoses   Final diagnoses:  Viral illness     Discharge Instructions     I believe that you have a viral illness causing your body aches headache. It is impossible for me to tell whether this is coronavirus, but I think the safest approach is to assume that it is You need to stay home for a week after symptoms Need to drink lots of fluids Try to limit your smoking Take Tylenol as needed for any pain or fever.  Avoid ibuprofen and Aleve If your symptoms worsen significantly, increasing fever weakness or shortness of breath, you need to go to the emergency room     Person Under Monitoring Name: Ethan Gray  Location: [address] [place] [address]   Infection Prevention Recommendations for Individuals Confirmed to have, or Being Evaluated for, 2019 Novel Coronavirus (COVID-19) Infection Who Receive Care at Home  Individuals who are confirmed to have, or are being evaluated for, COVID-19 should follow the prevention steps below until a healthcare provider or local or state health department says they can return to normal activities.  Stay home except to get  medical care You should restrict activities outside your home, except for getting medical care. Do not go to work, school, or public areas, and do not use public transportation or taxis.  Call ahead before visiting your doctor Before your medical appointment, call the healthcare provider and tell them that you have, or  are being evaluated for, COVID-19 infection. This will help the healthcare provider's office take steps to keep other people from getting infected. Ask your healthcare provider to call the local or state health department.  Monitor your symptoms Seek prompt medical attention if your illness is worsening (e.g., difficulty breathing). Before going to your medical appointment, call the healthcare provider and tell them that you have, or are being evaluated for, COVID-19 infection. Ask your healthcare provider to call the local or state health department.  Wear a facemask You should wear a facemask that covers your nose and mouth when you are in the same room with other people and when you visit a healthcare provider. People who live with or visit you should also wear a facemask while they are in the same room with you.  Separate yourself from other people in your home As much as possible, you should stay in a different room from other people in your home. Also, you should use a separate bathroom, if available.  Avoid sharing household items You should not share dishes, drinking glasses, cups, eating utensils, towels, bedding, or other items with other people in your home. After using these items, you should wash them thoroughly with soap and water.  Cover your coughs and sneezes Cover your mouth and nose with a tissue when you cough or sneeze, or you can cough or sneeze into your sleeve. Throw used tissues in a lined trash can, and immediately wash your hands with soap and water for at least 20 seconds or use an alcohol-based hand rub.  Wash your [employer]hands Wash your hands  often and thoroughly with soap and water for at least 20 seconds. You can use an alcohol-based hand sanitizer if soap and water are not available and if your hands are not visibly dirty. Avoid touching your eyes, nose, and mouth with unwashed hands.   Prevention Steps for Caregivers and Household Members of Individuals Confirmed to have, or Being Evaluated for, COVID-19 Infection Being Cared for in the Home  If you live with, or provide care at home for, a person confirmed to have, or being evaluated for, COVID-19 infection please follow these guidelines to prevent infection:  Follow healthcare provider's instructions Make sure that you understand and can help the patient follow any healthcare provider instructions for all care.  Provide for the patient's basic needs You should help the patient with basic needs in the home and provide support for getting groceries, prescriptions, and other personal needs.  Monitor the patient's symptoms If they are getting sicker, call his or her medical provider and tell them that the patient has, or is being evaluated for, COVID-19 infection. This will help the healthcare provider's office take steps to keep other people from getting infected. Ask the healthcare provider to call the local or state health department.  Limit the number of people who have contact with the patient  If possible, have only one caregiver for the patient.  Other household members should stay in another home or place of residence. If this is not possible, they should stay  in another room, or be separated from the patient as much as possible. Use a separate bathroom, if available.  Restrict visitors who do not have an essential need to be in the home.  Keep older adults, very young children, and other sick people away from the patient Keep older adults, very young children, and those who have compromised immune systems or chronic health conditions away from the patient.  This includes people with chronic heart, lung, or kidney conditions, diabetes, and cancer.  Ensure good ventilation Make sure that shared spaces in the home have good air flow, such as from an air conditioner or an opened window, weather permitting.  Wash your hands often  Wash your hands often and thoroughly with soap and water for at least 20 seconds. You can use an alcohol based hand sanitizer if soap and water are not available and if your hands are not visibly dirty.  Avoid touching your eyes, nose, and mouth with unwashed hands.  Use disposable paper towels to dry your hands. If not available, use dedicated cloth towels and replace them when they become wet.  Wear a facemask and gloves  Wear a disposable facemask at all times in the room and gloves when you touch or have contact with the patient's blood, body fluids, and/or secretions or excretions, such as sweat, saliva, sputum, nasal mucus, vomit, urine, or feces.  Ensure the mask fits over your nose and mouth tightly, and do not touch it during use.  Throw out disposable facemasks and gloves after using them. Do not reuse.  Wash your hands immediately after removing your facemask and gloves.  If your personal clothing becomes contaminated, carefully remove clothing and launder. Wash your hands after handling contaminated clothing.  Place all used disposable facemasks, gloves, and other waste in a lined container before disposing them with other household waste.  Remove gloves and wash your hands immediately after handling these items.  Do not share dishes, glasses, or other household items with the patient  Avoid sharing household items. You should not share dishes, drinking glasses, cups, eating utensils, towels, bedding, or other items with a patient who is confirmed to have, or being evaluated for, COVID-19 infection.  After the person uses these items, you should wash them thoroughly with soap and water.  Wash laundry  thoroughly  Immediately remove and wash clothes or bedding that have blood, body fluids, and/or secretions or excretions, such as sweat, saliva, sputum, nasal mucus, vomit, urine, or feces, on them.  Wear gloves when handling laundry from the patient.  Read and follow directions on labels of laundry or clothing items and detergent. In general, wash and dry with the warmest temperatures recommended on the label.  Clean all areas the individual has used often  Clean all touchable surfaces, such as counters, tabletops, doorknobs, bathroom fixtures, toilets, phones, keyboards, tablets, and bedside tables, every day. Also, clean any surfaces that may have blood, body fluids, and/or secretions or excretions on them.  Wear gloves when cleaning surfaces the patient has come in contact with.  Use a diluted bleach solution (e.g., dilute bleach with 1 part bleach and 10 parts water) or a household disinfectant with a label that says EPA-registered for coronaviruses. To make a bleach solution at home, add 1 tablespoon of bleach to 1 quart (4 cups) of water. For a larger supply, add  cup of bleach to 1 gallon (16 cups) of water.  Read labels of cleaning products and follow recommendations provided on product labels. Labels contain instructions for safe and effective use of the cleaning product including precautions you should take when applying the product, such as wearing gloves or eye protection and making sure you have good ventilation during use of the product.  Remove gloves and wash hands immediately after cleaning.  Monitor yourself for signs and symptoms of illness Caregivers and household members are considered close contacts, should monitor their health, and  will be asked to limit movement outside of the home to the extent possible. Follow the monitoring steps for close contacts listed on the symptom monitoring form.   ? If you have additional questions, contact your local health department  or call the epidemiologist on call at [phone] (available 24/7). ? This guidance is subject to change. For the most up-to-date guidance from [facility], please refer to their website: TripMetro.hu    ED Prescriptions    None     Controlled Substance Prescriptions [place] Controlled Substance Registry consulted? Not Applicable   Eustace Moore, MD 09/15/18 [phone]

## 2018-09-16 ENCOUNTER — Telehealth (HOSPITAL_COMMUNITY): Payer: Self-pay | Admitting: *Deleted

## 2018-09-16 NOTE — Telephone Encounter (Signed)
Pt's employer, [employer] [phone]), requesting return to work info.  States pt informed her he no longer has sxs, but she needs to know if sxs were/are R/T Covid-19.  Informed [employer] we are not able to disclose any medical info unless pt signs a release of medical info.  Msg left on pt's VM at phone # provided by spouse.

## 2018-09-16 NOTE — Telephone Encounter (Signed)
Spoke with pt.  Relayed info that medical release form would need to be signed in order to disclose any info to employer.  Pt wishes to contact employer himself at this point.

## 2018-11-05 ENCOUNTER — Ambulatory Visit
Admission: EM | Admit: 2018-11-05 | Discharge: 2018-11-05 | Disposition: A | Discharge status: Home / Self Care | Payer: Medicaid Other | Attending: Emergency Medicine | Admitting: Emergency Medicine

## 2018-11-05 ENCOUNTER — Telehealth: Payer: Self-pay | Admitting: *Deleted

## 2018-11-05 ENCOUNTER — Other Ambulatory Visit: Payer: Self-pay

## 2018-11-05 DIAGNOSIS — J069 Acute upper respiratory infection, unspecified: Secondary | ICD-10-CM

## 2018-11-05 DIAGNOSIS — B9789 Other viral agents as the cause of diseases classified elsewhere: Secondary | ICD-10-CM

## 2018-11-05 DIAGNOSIS — Z20822 Contact with and (suspected) exposure to covid-19: Secondary | ICD-10-CM

## 2018-11-05 HISTORY — DX: Unspecified asthma, uncomplicated: J45.909

## 2018-11-05 MED ORDER — ALBUTEROL SULFATE HFA 108 (90 BASE) MCG/ACT IN AERS: 1.0000 | INHALATION_SPRAY | Freq: Four times a day (QID) | RESPIRATORY_TRACT | 0 refills | Status: AC | PRN

## 2018-11-05 NOTE — Telephone Encounter (Signed)
Spoke with pt's wife,Princess and pt was scheduled for testing on 11/06/18 at [facility] site. Pt's wife advised that pt will need to wear a mask and remain in car at the time of appt. Understanding verbalized.

## 2018-11-05 NOTE — Discharge Instructions (Signed)
Push fluids to ensure adequate hydration and keep secretions thin.  Tylenol and/or ibuprofen as needed for pain or fevers.  Use of inhaler as needed for wheezing or shortness of breath.  Due to your symptoms in presence of pandemic I do recommend Covid-19 testing. You will be called to set up an appointment time to be tested at our [facility]. Results take about 2-3 days.  Please self isolate until you receive this results.

## 2018-11-05 NOTE — Telephone Encounter (Signed)
-----   Message from Zigmund Gottron, NP sent at 11/05/2018  1:07 PM EDT ----- Regarding: covid testing needed Mobile: [phone]

## 2018-11-05 NOTE — ED Triage Notes (Signed)
Pt c/o fever, sore throat and cough x2 days. Last tylenol was at 10am.

## 2018-11-05 NOTE — ED Provider Notes (Signed)
[facility]    CSN: [id number] Arrival date & time: 11/05/18  1237     History   Chief Complaint Chief Complaint  Patient presents with  . Fever    HPI Ethan Gray is a 28 y.o. male.   Ethan Gray presents with complaints of fever, tmax of 101. Cough, sneezing. Scratchy throat. Productive cough. Symptoms started yesterday. No shortness of breath , no chest pain . No gi symptoms. No rash. No ear pain. No loss of taste or smell. People where he works have been out with illness. Patient with asthma, has been using his inhaler. Supervisor at [employer], manufacturing type setting. There have been some with illness, although he doesn't know specifically if any recent with Covid-19.  Tylenol helps with fever. Took tylenol last at 0900 this am. No headache, no body aches.    ROS per HPI, negative if not otherwise mentioned.      Past Medical History:  Diagnosis Date  . Asthma     There are no active problems to display for this patient.   History reviewed. No pertinent surgical history.     Home Medications    Prior to Admission medications   Medication Sig Start Date End Date Taking? Authorizing Provider  albuterol (PROAIR HFA) 108 (90 Base) MCG/ACT inhaler Inhale 1-2 puffs into the lungs every 6 (six) hours as needed for wheezing or shortness of breath. 11/05/18   Georgetta HaberBurky, Roosvelt Churchwell B, NP    Family History Family History  Problem Relation Age of Onset  . Asthma Father     Social History Social History   Tobacco Use  . Smoking status: Never Smoker  . Smokeless tobacco: Never Used  Substance Use Topics  . Alcohol use: No    Frequency: Never  . 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   Review of Systems Review of Systems   Physical Exam Triage Vital Signs ED Triage Vitals  Enc Vitals Group     BP 11/05/18 1249 112/74     Pulse Rate 11/05/18 1249 (!)  58     Resp 11/05/18 1249 18     Temp 11/05/18 1249 98.3 F (36.8 C)     Temp Source 11/05/18 1249 Oral     SpO2 11/05/18 1249 98 %     Weight --      Height --      Head Circumference --      Peak Flow --      Pain Score 11/05/18 1250 0     Pain Loc --      Pain Edu? --      Excl. in GC? --    No data found.  Updated Vital Signs BP 112/74 (BP Location: Left Arm)   Pulse (!) 58   Temp 98.3 F (36.8 C) (Oral)   Resp 18   SpO2 98%   Visual Acuity Right Eye Distance:   Left Eye Distance:   Bilateral Distance:    Right Eye Near:   Left Eye Near:    Bilateral Near:     Physical Exam Constitutional:      Appearance: He is well-developed.  Cardiovascular:     Rate and Rhythm: Normal rate.  Pulmonary:     Effort: Pulmonary effort is normal.  Skin:    General: Skin is warm and dry.  Neurological:  Mental Status: He is alert and oriented to person, place, and time.      UC Treatments / Results  Labs (all labs ordered are listed, but only abnormal results are displayed) Labs Reviewed - No data to display  EKG None  Radiology No results found.  Procedures Procedures (including critical care time)  Medications Ordered in UC Medications - No data to display  Initial Impression / Assessment and Plan / UC Course  I have reviewed the triage vital signs and the nursing notes.  Pertinent labs & imaging results that were available during my care of the patient were reviewed by me and considered in my medical decision making (see chart for details).     Non toxic. Benign physical exam.  No increased work of breathing. No hypoxia, tachypnea or tachycardia. Due to work environment do recommend covid-19 testing prior to return to work. Recommended self-isolation until results return. Return precautions provided. Patient verbalized understanding and agreeable to plan.  Final Clinical Impressions(s) / UC Diagnoses   Final diagnoses:  Viral URI with cough      Discharge Instructions     Push fluids to ensure adequate hydration and keep secretions thin.  Tylenol and/or ibuprofen as needed for pain or fevers.  Use of inhaler as needed for wheezing or shortness of breath.  Due to your symptoms in presence of pandemic I do recommend Covid-19 testing. You will be called to set up an appointment time to be tested at our [facility]. Results take about 2-3 days.  Please self isolate until you receive this results.       ED Prescriptions    Medication Sig Dispense Auth. Provider   albuterol (PROAIR HFA) 108 (90 Base) MCG/ACT inhaler Inhale 1-2 puffs into the lungs every 6 (six) hours as needed for wheezing or shortness of breath. 6.7 g Augusto Gamble B, NP     Controlled Substance Prescriptions [place] Controlled Substance Registry consulted? Not Applicable   Zigmund Gottron, NP 11/05/18 1338

## 2018-11-06 ENCOUNTER — Other Ambulatory Visit: Payer: Medicaid Other

## 2018-11-06 DIAGNOSIS — Z20822 Contact with and (suspected) exposure to covid-19: Secondary | ICD-10-CM

## 2018-11-10 LAB — NOVEL CORONAVIRUS, NAA: SARS-CoV-2, NAA: NOT DETECTED

## 2019-08-21 ENCOUNTER — Emergency Department (HOSPITAL_COMMUNITY)
Admission: EM | Admit: 2019-08-21 | Discharge: 2019-08-21 | Disposition: A | Discharge status: Home / Self Care | Payer: Self-pay | Attending: Emergency Medicine | Admitting: Emergency Medicine

## 2019-08-21 ENCOUNTER — Encounter (HOSPITAL_COMMUNITY): Payer: Self-pay | Admitting: Emergency Medicine

## 2019-08-21 ENCOUNTER — Other Ambulatory Visit: Payer: Self-pay

## 2019-08-21 ENCOUNTER — Ambulatory Visit
Admission: EM | Admit: 2019-08-21 | Discharge: 2019-08-21 | Disposition: A | Discharge status: Home / Self Care | Payer: Self-pay

## 2019-08-21 DIAGNOSIS — M5431 Sciatica, right side: Secondary | ICD-10-CM

## 2019-08-21 DIAGNOSIS — K6289 Other specified diseases of anus and rectum: Secondary | ICD-10-CM

## 2019-08-21 DIAGNOSIS — R198 Other specified symptoms and signs involving the digestive system and abdomen: Secondary | ICD-10-CM

## 2019-08-21 DIAGNOSIS — R300 Dysuria: Secondary | ICD-10-CM

## 2019-08-21 DIAGNOSIS — M5417 Radiculopathy, lumbosacral region: Secondary | ICD-10-CM | POA: Insufficient documentation

## 2019-08-21 DIAGNOSIS — R200 Anesthesia of skin: Secondary | ICD-10-CM

## 2019-08-21 DIAGNOSIS — M5441 Lumbago with sciatica, right side: Secondary | ICD-10-CM | POA: Insufficient documentation

## 2019-08-21 LAB — COMPREHENSIVE METABOLIC PANEL
ALT: 16 U/L (ref 0–44)
AST: 19 U/L (ref 15–41)
Albumin: 4.1 g/dL (ref 3.5–5.0)
Alkaline Phosphatase: 73 U/L (ref 38–126)
Anion gap: 10 (ref 5–15)
BUN: 10 mg/dL (ref 6–20)
CO2: 27 mmol/L (ref 22–32)
Calcium: 9.6 mg/dL (ref 8.9–10.3)
Chloride: 103 mmol/L (ref 98–111)
Creatinine, Ser: 1.08 mg/dL (ref 0.61–1.24)
GFR calc Af Amer: >60 mL/min (ref >60)
GFR calc non Af Amer: >60 mL/min (ref >60)
Glucose, Bld: 99 mg/dL (ref 70–99)
Potassium: 3.8 mmol/L (ref 3.5–5.1)
Sodium: 140 mmol/L (ref 135–145)
Total Bilirubin: 0.3 mg/dL (ref 0.3–1.2)
Total Protein: 7.1 g/dL (ref 6.5–8.1)

## 2019-08-21 LAB — CBC WITH DIFFERENTIAL/PLATELET
Abs Immature Granulocytes: 0.04 10*3/uL (ref 0.00–0.07)
Basophils Absolute: 0.1 10*3/uL (ref 0.0–0.1)
Basophils Relative: 1 %
Eosinophils Absolute: 0.5 10*3/uL (ref 0.0–0.5)
Eosinophils Relative: 6 %
HCT: 40 % (ref 39.0–52.0)
Hemoglobin: 12.2 g/dL — ABNORMAL LOW (ref 13.0–17.0)
Immature Granulocytes: 0 %
Lymphocytes Relative: 34 %
Lymphs Abs: 3.2 10*3/uL (ref 0.7–4.0)
MCH: 23.1 pg — ABNORMAL LOW (ref 26.0–34.0)
MCHC: 30.5 g/dL (ref 30.0–36.0)
MCV: 75.9 fL — ABNORMAL LOW (ref 80.0–100.0)
Monocytes Absolute: 0.7 10*3/uL (ref 0.1–1.0)
Monocytes Relative: 7 %
Neutro Abs: 5 10*3/uL (ref 1.7–7.7)
Neutrophils Relative %: 52 %
Platelets: 403 10*3/uL — ABNORMAL HIGH (ref 150–400)
RBC: 5.27 MIL/uL (ref 4.22–5.81)
RDW: 13.5 % (ref 11.5–15.5)
WBC: 9.7 10*3/uL (ref 4.0–10.5)
nRBC: 0 % (ref 0.0–0.2)

## 2019-08-21 LAB — URINALYSIS, ROUTINE W REFLEX MICROSCOPIC
Bilirubin Urine: NEGATIVE
Glucose, UA: NEGATIVE mg/dL
Hgb urine dipstick: NEGATIVE
Ketones, ur: NEGATIVE mg/dL
Leukocytes,Ua: NEGATIVE
Nitrite: NEGATIVE
Protein, ur: NEGATIVE mg/dL
Specific Gravity, Urine: 1.016 (ref 1.005–1.030)
pH: 6 (ref 5.0–8.0)

## 2019-08-21 MED ORDER — METHOCARBAMOL 500 MG PO TABS
500.0000 mg | ORAL_TABLET | Freq: Once | ORAL | Status: AC
  Administered 2019-08-21: 22:00:00 500 mg via ORAL
  Filled 2019-08-21: qty 1

## 2019-08-21 MED ORDER — MELOXICAM 7.5 MG PO TABS: 7.5000 mg | ORAL_TABLET | Freq: Every day | ORAL | 0 refills | Status: AC

## 2019-08-21 MED ORDER — METHOCARBAMOL 500 MG PO TABS: 500.0000 mg | ORAL_TABLET | Freq: Two times a day (BID) | ORAL | 0 refills | Status: DC

## 2019-08-21 MED ORDER — KETOROLAC TROMETHAMINE 15 MG/ML IJ SOLN
30.0000 mg | Freq: Once | INTRAMUSCULAR | Status: AC
  Administered 2019-08-21: 22:00:00 30 mg via INTRAMUSCULAR
  Filled 2019-08-21: qty 2

## 2019-08-21 NOTE — Discharge Instructions (Signed)
Recommend you go to ER for further evaluation of your symptoms. °

## 2019-08-21 NOTE — ED Provider Notes (Signed)
[facility] EMERGENCY DEPARTMENT Provider Note   CSN: [id number] Arrival date & time: 08/21/19  1929     History Chief Complaint  Patient presents with  . Sciatica    Ethan Gray is a 29 y.o. male.  29 year old male presents with past medical history of sciatica, states pain is getting worse now with numbness to his entire right leg and states that he has been constipated for the past 2 days.  Denies bowel or bladder incontinence, groin numbness, falls or injuries, fevers, IV drug use.  Patient was seen in urgent care today, concern for decreased rectal tone with complaint of leg numbness and sent to the ER for further evaluation.        Past Medical History:  Diagnosis Date  . Asthma     There are no problems to display for this patient.   History reviewed. No pertinent surgical history.     Family History  Problem Relation Age of Onset  . Asthma Father     Social History   Tobacco Use  . Smoking status: Never Smoker  . Smokeless tobacco: Never Used  Substance Use Topics  . Alcohol use: No  . 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 Authorizing Provider  albuterol (PROAIR HFA) 108 (90 Base) MCG/ACT inhaler Inhale 1-2 puffs into the lungs every 6 (six) hours as needed for wheezing or shortness of breath. 11/05/18   Zigmund Gottron, NP  meloxicam (MOBIC) 7.5 MG tablet Take 1 tablet (7.5 mg total) by mouth daily for 10 days. 08/21/19 08/31/19  Tacy Learn, PA-C  methocarbamol (ROBAXIN) 500 MG tablet Take 1 tablet (500 mg total) by mouth 2 (two) times daily. 08/21/19   Tacy Learn, PA-C    Allergies    Patient has no known allergies.  Review of Systems   Review of Systems  Constitutional: Negative for fever.  Gastrointestinal: Positive for constipation. Negative for abdominal pain, blood in stool and diarrhea.    Genitourinary: Negative for decreased urine volume, difficulty urinating and urgency.  Musculoskeletal: Positive for arthralgias, back pain, gait problem and myalgias.  Skin: Negative for rash and wound.  Allergic/Immunologic: Negative for immunocompromised state.  Neurological: Positive for weakness and numbness.  All other systems reviewed and are negative.   Physical Exam Updated Vital Signs BP 121/78 (BP Location: Left Arm)   Pulse (!) 45   Temp 99.2 F (37.3 C) (Oral)   Resp 16   Ht 5\' 11"  (1.803 m)   Wt 80 kg   SpO2 100%   BMI 24.60 kg/m   Physical Exam Vitals and nursing note reviewed.  Constitutional:      General: He is not in acute distress.    Appearance: He is well-developed. He is not diaphoretic.  HENT:     Head: Normocephalic and atraumatic.  Cardiovascular:     Pulses: Normal pulses.  Pulmonary:     Effort: Pulmonary effort is normal.  Abdominal:     Palpations: Abdomen is soft.     Tenderness: There is no abdominal tenderness.  Musculoskeletal:        General: Tenderness present. No swelling, deformity or signs of injury.     Thoracic back: No tenderness or bony tenderness. Normal range of motion.     Lumbar back: Tenderness present. No bony tenderness. Normal range of motion. Positive  right straight leg raise test. Negative left straight leg raise test.     Right lower leg: No edema.     Left lower leg: No edema.     Comments: Tenderness with palpation right lower back to right SI area.  Reflexes symmetric to lower extremities, no groin numbness.  Numbness appears to be limited to the lateral portion of the right leg as well as diffuse foot.  Patient has symmetric hip flexion and knee extension and flexion, has weakness at the right ankle.  Skin:    General: Skin is warm and dry.  Neurological:     Mental Status: He is alert and oriented to person, place, and time.     Sensory: Sensory deficit present.     Motor: Weakness present. No abnormal muscle  tone.     Gait: Gait is intact.     Deep Tendon Reflexes: Reflexes normal. Babinski sign absent on the right side. Babinski sign absent on the left side.     Reflex Scores:      Brachioradialis reflexes are 2+ on the right side and 2+ on the left side.      Patellar reflexes are 2+ on the right side and 2+ on the left side.      Achilles reflexes are 2+ on the right side and 2+ on the left side. Psychiatric:        Behavior: Behavior normal.     ED Results / Procedures / Treatments   Labs (all labs ordered are listed, but only abnormal results are displayed) Labs Reviewed  CBC WITH DIFFERENTIAL/PLATELET - Abnormal; Notable for the following components:      Result Value   Hemoglobin 12.2 (*)    MCV 75.9 (*)    MCH 23.1 (*)    Platelets 403 (*)    All other components within normal limits  COMPREHENSIVE METABOLIC PANEL  URINALYSIS, ROUTINE W REFLEX MICROSCOPIC    EKG None  Radiology No results found.  Procedures Procedures (including critical care time)  Medications Ordered in ED Medications  ketorolac (TORADOL) 15 MG/ML injection 30 mg (30 mg Intramuscular Given 08/21/19 2140)  methocarbamol (ROBAXIN) tablet 500 mg (500 mg Oral Given 08/21/19 2140)    ED Course  I have reviewed the triage vital signs and the nursing notes.  Pertinent labs & imaging results that were available during my care of the patient were reviewed by me and considered in my medical decision making (see chart for details).  Clinical Course as of Aug 21 2150  Fri Aug 21, 2019  [id number] 29 year old male with complaint of sciatica worsening pain with numbness to the entire right leg.  Patient was seen in urgent care today and sent to the ER for further evaluation with concern for decreased rectal tone, constipation and entire right leg numbness. On exam, patient has numbness to the lateral aspect of the right leg, there is no other numbness appreciable when comparing left to right with sharp and dull.   There is no groin numbness.  Reflexes are symmetric including negative Babinski, Achilles, patella.  Patient has normal strength for hip flexion and knee flexion as well as knee extension.  Patient has subjective weakness of the left ankle when asked to push and pull with his foot.  Patient has a normal gait and is ambulatory without assistance.  Patient does have tenderness to his right lower back and right SI area consistent with his history of sciatica.  Patient does not take anything currently for  his pain, has previously tried Flexeril without improvement.  Plan is to give patient injection of Toradol while in the ER, discharged with Robaxin and meloxicam with referral to orthopedics for further evaluation.  Case was discussed with Dr. Criss Alvine, agrees patient does not need MRI to rule out cauda equina at this time.   [LM]    Clinical Course User Index [LM] Alden Hipp   MDM Rules/Calculators/A&P                      Final Clinical Impression(s) / ED Diagnoses Final diagnoses:  Sciatica of right side  Lumbosacral radiculopathy    Rx / DC Orders ED Discharge Orders         Ordered    methocarbamol (ROBAXIN) 500 MG tablet  2 times daily     08/21/19 2133    meloxicam (MOBIC) 7.5 MG tablet  Daily     08/21/19 2133           Jeannie Fend, PA-C 08/21/19 2152    Pricilla Loveless, MD 08/24/19 1702

## 2019-08-21 NOTE — ED Triage Notes (Signed)
Pt c/o rt lower back pain radiating down rt leg causing leg numbness x1wk. States hx of siatica nerve pain to rt side from a deteriorating disk to lower back. States hasn't seen his orthopedic in over a year. States hasn't had a BM in 2 days, states feels pressure like he needs to but can't.

## 2019-08-21 NOTE — ED Triage Notes (Signed)
Patient reports low back pain radiating ri right leg with pain/numbness for several days , seen at urgent care today sent here for MRI/diagnosed with sciatica .

## 2019-08-21 NOTE — ED Provider Notes (Signed)
[facility]    CSN: [id number] Arrival date & time: 08/21/19  1816      History   Chief Complaint Chief Complaint  Patient presents with  . Back Pain    HPI Ethan Gray is a 29 y.o. male with self reported lumbar DDD and chronic back issues presenting for 1 week course of right low back pain with radiation down right leg.  Patient states this feels different from previous episodes of sciatica and that his whole right leg is numb.  Patient also feels like he needs to have a bowel movement but cannot: Last bowel movement was 2 days ago: Without pain, diarrhea, blood, melena.  Patient denies abdominal pain, nausea, vomiting, fever.  Denies recent injury.  Has not seen orthopedics in over a year.    Past Medical History:  Diagnosis Date  . Asthma     There are no problems to display for this patient.   History reviewed. No pertinent surgical history.     Home Medications    Prior to Admission medications   Medication Sig Start Date End Date Taking? Authorizing Provider  albuterol (PROAIR HFA) 108 (90 Base) MCG/ACT inhaler Inhale 1-2 puffs into the lungs every 6 (six) hours as needed for wheezing or shortness of breath. 11/05/18   Georgetta Haber, NP    Family History Family History  Problem Relation Age of Onset  . Asthma Father     Social History Social History   Tobacco Use  . Smoking status: Never Smoker  . Smokeless tobacco: Never Used  Substance Use Topics  . Alcohol use: No  . 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   Review of Systems As per HPI   Physical Exam Triage Vital Signs ED Triage Vitals  Enc Vitals Group     BP      Pulse      Resp      Temp      Temp src      SpO2      Weight      Height      Head Circumference      Peak Flow      Pain Score      Pain Loc      Pain Edu?      Excl. in [place]?    No data found.  Updated Vital Signs BP  127/70 (BP Location: Left Arm)   Pulse 88   Temp 99 F (37.2 C) (Oral)   Resp 16   SpO2 97%   Visual Acuity Right Eye Distance:   Left Eye Distance:   Bilateral Distance:    Right Eye Near:   Left Eye Near:    Bilateral Near:     Physical Exam Constitutional:      General: He is not in acute distress. HENT:     Head: Normocephalic and atraumatic.  Eyes:     General: No scleral icterus.    Pupils: Pupils are equal, round, and reactive to light.  Cardiovascular:     Rate and Rhythm: Normal rate.  Pulmonary:     Effort: Pulmonary effort is normal. No respiratory distress.     Breath sounds: No wheezing.  Genitourinary:    Comments: No stool in rectal vault.  Decreased anal sphincter tone Musculoskeletal:     Right lower leg: No edema.  Left lower leg: No edema.     Comments: Decreased range of motion of right hip.  Reported decreased sensation diffusely throughout right leg.  Patellar and Achilles reflexes 2+ bilaterally.  Skin:    Coloration: Skin is not jaundiced or pale.     Findings: No bruising, erythema or rash.  Neurological:     Mental Status: He is alert and oriented to person, place, and time.      UC Treatments / Results  Labs (all labs ordered are listed, but only abnormal results are displayed) Labs Reviewed - No data to display  EKG   Radiology No results found.  Procedures Procedures (including critical care time)  Medications Ordered in UC Medications - No data to display  Initial Impression / Assessment and Plan / UC Course  I have reviewed the triage vital signs and the nursing notes.  Pertinent labs & imaging results that were available during my care of the patient were reviewed by me and considered in my medical decision making (see chart for details).     Patient afebrile, nontoxic.  Patient does have concerning symptoms including complete right leg numbness, decreased rectal sphincter tone.  Recommended patient go to ER for  further evaluation.  Electing to self transport in stable condition with wife driving.   Final Clinical Impressions(s) / UC Diagnoses   Final diagnoses:  Right leg numbness  Acute right-sided low back pain with right-sided sciatica  Tenesmus  Dysuria  Decreased rectal sphincter tone     Discharge Instructions     Recommend you go to ER for further evaluation of your symptoms.    ED Prescriptions    None     PDMP not reviewed this encounter.   Neldon Mc Ferris, [place] 08/21/19 1938

## 2019-08-27 ENCOUNTER — Other Ambulatory Visit: Payer: Self-pay

## 2019-08-27 ENCOUNTER — Encounter: Payer: Self-pay | Admitting: Emergency Medicine

## 2019-08-27 ENCOUNTER — Ambulatory Visit
Admission: EM | Admit: 2019-08-27 | Discharge: 2019-08-27 | Disposition: A | Discharge status: Home / Self Care | Payer: Self-pay

## 2019-08-27 DIAGNOSIS — M5441 Lumbago with sciatica, right side: Secondary | ICD-10-CM

## 2019-08-27 NOTE — ED Triage Notes (Signed)
Patient seen 08/21/19  Patient was advised to see specialist, but unable to do so without insurance and no money.  Waiting for insurance to come through.  Patient say back and leg pain no better.

## 2019-08-27 NOTE — ED Provider Notes (Signed)
[facility]    CSN: [id number] Arrival date & time: 08/27/19  1111      History   Chief Complaint Chief Complaint  Patient presents with  . Back Pain    HPI Ethan Gray is a 29 y.o. male.   29 year old male comes in for continued right sciatica symptoms after being seen 08/21/2019 at the emergency department.  At that time, was given Toradol injection, Mobic, Robaxin.  Patient has been taking Mobic as directed without any improvement.  States has not taken Robaxin, as he has taken Flexeril in the past without relief of sciatica pain. States was told to see orthopedics, but has not been able to due to finances.  He denies any saddle anesthesia, loss of bladder or bowel control.  Denies any leg weakness.  Denies any worsening symptoms. States work requires heavy lifting, and has not been able to do this due to the pain.      Past Medical History:  Diagnosis Date  . Asthma     There are no problems to display for this patient.   History reviewed. No pertinent surgical history.     Home Medications    Prior to Admission medications   Medication Sig Start Date End Date Taking? Authorizing Provider  meloxicam (MOBIC) 7.5 MG tablet Take 1 tablet (7.5 mg total) by mouth daily for 10 days. 08/21/19 08/31/19 Yes Tacy Learn, PA-C  albuterol [facility] HFA) 108 (90 Base) MCG/ACT inhaler Inhale 1-2 puffs into the lungs every 6 (six) hours as needed for wheezing or shortness of breath. 11/05/18   Zigmund Gottron, NP  methocarbamol (ROBAXIN) 500 MG tablet Take 1 tablet (500 mg total) by mouth 2 (two) times daily. 08/21/19   Tacy Learn, PA-C    Family History Family History  Problem Relation Age of Onset  . Asthma Father     Social History Social History   Tobacco Use  . Smoking status: Never Smoker  . Smokeless tobacco: Never Used  Substance Use Topics  . Alcohol use: No  . 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   Review of Systems Review of Systems  Reason unable to perform ROS: See HPI as above.     Physical Exam Triage Vital Signs ED Triage Vitals  Enc Vitals Group     BP 08/27/19 1115 125/78     Pulse Rate 08/27/19 1115 (!) 58     Resp 08/27/19 1115 16     Temp 08/27/19 1115 98.3 F (36.8 C)     Temp Source 08/27/19 1115 Oral     SpO2 08/27/19 1115 99 %     Weight --      Height --      Head Circumference --      Peak Flow --      Pain Score 08/27/19 1130 7     Pain Loc --      Pain Edu? --      Excl. in [place]? --    No data found.  Updated Vital Signs BP 125/78 (BP Location: Left Arm)   Pulse (!) 58   Temp 98.3 F (36.8 C) (Oral)   Resp 16   SpO2 99%   Physical Exam Constitutional:      General: He is not in acute distress.    Appearance: Normal appearance. He is well-developed. He is not toxic-appearing  or diaphoretic.  HENT:     Head: Normocephalic and atraumatic.  Eyes:     Conjunctiva/sclera: Conjunctivae normal.     Pupils: Pupils are equal, round, and reactive to light.  Pulmonary:     Effort: Pulmonary effort is normal. No respiratory distress.     Comments: Speaking in full sentences without difficulty Musculoskeletal:     Cervical back: Normal range of motion and neck supple.     Comments: Tenderness to palpation of right lumbar region. Decreased ROM of the back. Decreased active ROM of hip. Decreased passive ROM as patient resisting motion due to pain. Strength deferred. Sensation intact and equal.   Skin:    General: Skin is warm and dry.  Neurological:     Mental Status: He is alert and oriented to person, place, and time.      UC Treatments / Results  Labs (all labs ordered are listed, but only abnormal results are displayed) Labs Reviewed - No data to display  EKG   Radiology No results found.  Procedures Procedures (including critical care time)  Medications Ordered in UC Medications - No data to  display  Initial Impression / Assessment and Plan / UC Course  I have reviewed the triage vital signs and the nursing notes.  Pertinent labs & imaging results that were available during my care of the patient were reviewed by me and considered in my medical decision making (see chart for details).    Offered prednisone for symptoms, for which patient states has taken before without relief. He would like to defer medications at this time. States would like work note for current symptoms. Resources provided. Return precautions given.  Final Clinical Impressions(s) / UC Diagnoses   Final diagnoses:  Acute right-sided low back pain with right-sided sciatica    ED Prescriptions    None     PDMP not reviewed this encounter.   Belinda Fisher, PA-C 08/27/19 1316

## 2019-08-27 NOTE — Discharge Instructions (Signed)
You can try robaxin if you'd like. Stretching exercises as attached. Please follow up with PCP/specialist for further evaluation.

## 2019-09-01 ENCOUNTER — Encounter: Payer: Self-pay | Admitting: Internal Medicine

## 2019-09-01 ENCOUNTER — Ambulatory Visit: Payer: Self-pay | Admitting: Internal Medicine

## 2019-09-01 ENCOUNTER — Other Ambulatory Visit: Payer: Self-pay

## 2019-09-01 VITALS — BP 137/76 | HR 89 | Temp 98.4°F | Wt 146.8 lb

## 2019-09-01 DIAGNOSIS — R350 Frequency of micturition: Secondary | ICD-10-CM

## 2019-09-01 DIAGNOSIS — J45909 Unspecified asthma, uncomplicated: Secondary | ICD-10-CM | POA: Insufficient documentation

## 2019-09-01 DIAGNOSIS — G8929 Other chronic pain: Secondary | ICD-10-CM

## 2019-09-01 DIAGNOSIS — Z79899 Other long term (current) drug therapy: Secondary | ICD-10-CM

## 2019-09-01 DIAGNOSIS — D509 Iron deficiency anemia, unspecified: Secondary | ICD-10-CM

## 2019-09-01 DIAGNOSIS — Z791 Long term (current) use of non-steroidal anti-inflammatories (NSAID): Secondary | ICD-10-CM

## 2019-09-01 DIAGNOSIS — K5900 Constipation, unspecified: Secondary | ICD-10-CM

## 2019-09-01 DIAGNOSIS — M5441 Lumbago with sciatica, right side: Secondary | ICD-10-CM

## 2019-09-01 DIAGNOSIS — D649 Anemia, unspecified: Secondary | ICD-10-CM

## 2019-09-01 DIAGNOSIS — M5387 Other specified dorsopathies, lumbosacral region: Secondary | ICD-10-CM

## 2019-09-01 MED ORDER — MELOXICAM 7.5 MG PO TABS: 15.0000 mg | ORAL_TABLET | Freq: Every day | ORAL | 2 refills | Status: AC

## 2019-09-01 NOTE — Assessment & Plan Note (Signed)
Patient with sciatica presents to the [facility] to establish care. States that he has had sciatica with radiation to the R foot for 2.5 years. He had been following with [facility], but due to high copay, they came to the [facility].   Patient has been on Mobic 7.5 mg QD, Robaxin 500 mg BID, and Tylenol for pain control with little relief. He denies urinary/fecal incontinence, saddle paraesthesias, or focal weakness.   Plan:  - Increase Mobic to 15mg  QD - Patient will make appointment with financial counseling  - Educated on alarm symptoms and need to go to ED if symptoms occur.

## 2019-09-01 NOTE — Assessment & Plan Note (Signed)
Patient arrives to the clinic with microcytic anemia with a hgb of 12.2 and a MCV of 76.   Plan:  - Ferritin - Iron and TIBC

## 2019-09-01 NOTE — Progress Notes (Signed)
CC: Sciatica  HPI:  [name] is a 29 y.o., with a PMH below, who present to the [facility] to establish care for his sciatica. To see the management of his acute and chronic conditions, please refer to the A&P note.   Past Medical History:  Diagnosis Date  . Asthma   . Sciatica of right side associated with disorder of lumbosacral spine    Past Surgical History:  No surgical history  Prior Hospitalizations:  No prior hospitalizations   Family History:  HTN: Dad CAD: None Strokes: none Diabetes none  Asthma: Self, father, son  Cancers: None    Medications:  Meloxicam 7.5 mg QD-> sciatica Rebuxicam 500 mg BID->sciatica Tylenol: Sciatica PRN Q4H->sciatica Albuterol: Asthma 2 puffs   Social History:  Housing: Apartment Married: Yes Level of Education: GED Diet:Snack throughout day, then large dinner Tobacco: Denies ETOH: Socially  Drugs: Cannabis   Allergies:  None  Review of Systems:   Review of Systems  Constitutional: Positive for malaise/fatigue. Negative for chills, diaphoresis and fever.  HENT: Negative for congestion, ear discharge, ear pain, hearing loss, nosebleeds, sinus pain and tinnitus.   Eyes: Negative for blurred vision, double vision, photophobia, pain, discharge and redness.  Respiratory: Negative for cough, hemoptysis, shortness of breath and wheezing.   Cardiovascular: Negative for chest pain, palpitations, orthopnea, claudication and leg swelling.  Gastrointestinal: Positive for constipation. Negative for abdominal pain, blood in stool, diarrhea, heartburn, melena, nausea and vomiting.  Genitourinary: Positive for frequency. Negative for dysuria, flank pain, hematuria and urgency.  Musculoskeletal: Positive for back pain. Negative for falls, joint pain and neck pain.       Lumbosacral pain chronic 2.5 years  Skin: Negative for itching and rash.  Neurological: Positive for tingling and loss of consciousness. Negative for dizziness,  tremors, speech change, seizures and headaches.       Numbness and tingling in the R foot Throughout the day the numbness and tingling worsens throughout the day.   2016-2017 passed out in the warm shower x2, prodromal period dizziness. Workup unrevealing  Endo/Heme/Allergies: Negative for environmental allergies. Does not bruise/bleed easily.  Psychiatric/Behavioral: Negative for depression, hallucinations, memory loss, substance abuse and suicidal ideas. The patient does not have insomnia.      Physical Exam:  Vitals:   09/01/19 0904  BP: 137/76  Pulse: 89  Temp: 98.4 F (36.9 C)  TempSrc: Oral  SpO2: 100%  Weight: 146 lb 12.8 oz (66.6 kg)   Physical Exam Vitals and nursing note reviewed.  Constitutional:      General: He is not in acute distress.    Appearance: Normal appearance. He is not ill-appearing or toxic-appearing.  HENT:     Head: Normocephalic and atraumatic.  Eyes:     General:        Right eye: No discharge.        Left eye: No discharge.     Conjunctiva/sclera: Conjunctivae normal.  Cardiovascular:     Rate and Rhythm: Normal rate and regular rhythm.     Pulses: Normal pulses.     Heart sounds: Normal heart sounds. No murmur. No friction rub. No gallop.   Pulmonary:     Effort: Pulmonary effort is normal.     Breath sounds: Normal breath sounds. No wheezing, rhonchi or rales.  Abdominal:     General: Bowel sounds are normal.     Palpations: Abdomen is soft.     Tenderness: There is no abdominal tenderness. There is no guarding.  Musculoskeletal:  General: No swelling.     Right lower leg: No edema.     Left lower leg: No edema.  Neurological:     General: No focal deficit present.     Mental Status: He is alert and oriented to person, place, and time.  Psychiatric:        Mood and Affect: Mood normal.        Behavior: Behavior normal.     Assessment & Plan:   See Encounters Tab for problem based charting.  Patient discussed with Dr.  Heber [place]

## 2019-09-01 NOTE — Patient Instructions (Addendum)
To Mr. Ashland,  It was a pleasure meeting you today! Today we discussed your sciatica. We will increase your Mobic from 7.5 mg to 15 mg daily. Additionally, please set up an appointment with financial counseling. I will have you come back in a month for an evaluation for work. Also tell the sports medicine clinic about your work needs as well, and they may address it on your visit for the 26. Have a good day! Sincerely,  Dolan Amen, MD

## 2019-09-01 NOTE — Assessment & Plan Note (Signed)
Patient with a history of asthma since childhood presents to the [facility]. He is well controlled on albuterol, and occasionally uses his medication.  Plan:  - Continue Proair HFA 108 1-2 puffs Q6H

## 2019-09-02 LAB — IRON AND TIBC
Iron Saturation: 17 % (ref 15–55)
Iron: 50 ug/dL (ref 38–169)
Total Iron Binding Capacity: 291 ug/dL (ref 250–450)
UIBC: 241 ug/dL (ref 111–343)

## 2019-09-02 LAB — FERRITIN: Ferritin: 95 ng/mL (ref 30–400)

## 2019-09-02 NOTE — Progress Notes (Signed)
Internal Medicine Clinic Attending  Case discussed with Dr. Winters at the time of the visit.  We reviewed the resident's history and exam and pertinent patient test results.  I agree with the assessment, diagnosis, and plan of care documented in the resident's note.  

## 2019-09-03 ENCOUNTER — Ambulatory Visit
Admission: EM | Admit: 2019-09-03 | Discharge: 2019-09-03 | Disposition: A | Discharge status: Home / Self Care | Payer: Self-pay | Attending: Emergency Medicine | Admitting: Emergency Medicine

## 2019-09-03 DIAGNOSIS — G8929 Other chronic pain: Secondary | ICD-10-CM

## 2019-09-03 DIAGNOSIS — M5441 Lumbago with sciatica, right side: Secondary | ICD-10-CM

## 2019-09-03 MED ORDER — METHYLPREDNISOLONE SODIUM SUCC 125 MG IJ SOLR
80.0000 mg | Freq: Once | INTRAMUSCULAR | Status: AC
  Administered 2019-09-03: 80 mg via INTRAMUSCULAR

## 2019-09-03 MED ORDER — METHOCARBAMOL 500 MG PO TABS: 500.0000 mg | ORAL_TABLET | Freq: Two times a day (BID) | ORAL | 0 refills | Status: AC

## 2019-09-03 NOTE — ED Provider Notes (Signed)
[facility]    CSN: [id number] Arrival date & time: 09/03/19  0940      History   Chief Complaint Chief Complaint  Patient presents with  . Back Pain    HPI Ethan Gray is a 29 y.o. male with history of right-sided sciatica, asthma presenting for persistent right low back pain with intermittent right sided sciatica.  Patient states that he finally got insurance, has appointment with [facility] on 5/1: Intends to keep this.  Saw his internal medicine doctor yesterday who gave him Mobic.  Patient feels this is not provide much pain relief.  Patient feels he got better pain relief from Robaxin upon discharge from ER.  Requesting refill of this.  Patient last evaluated for this here on 08/27/2019: Offered prednisone for symptoms which patient declined due to lack of relief historically.  Patient elected to defer medications at that time, the requested work note.  Patient also needing work note today as he performs lots of bending and heavy lifting.  Past Medical History:  Diagnosis Date  . Anemia   . Asthma   . 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 Asthma   . Anemia     History reviewed. No pertinent surgical history.     Home Medications    Prior to Admission medications   Medication Sig Start Date End Date Taking? Authorizing Provider  albuterol (PROAIR HFA) 108 (90 Base) MCG/ACT inhaler Inhale 1-2 puffs into the lungs every 6 (six) hours as needed for wheezing or shortness of breath. 11/05/18   Zigmund Gottron, NP  meloxicam (MOBIC) 7.5 MG tablet Take 2 tablets (15 mg total) by mouth daily. 09/01/19 08/31/20  Maudie Mercury, MD  methocarbamol (ROBAXIN) 500 MG tablet Take 1 tablet (500 mg total) by mouth 2 (two) times daily. 09/03/19   Hall-Potvin, [place], PA-C    Family  History Family History  Problem Relation Age of Onset  . Asthma Father   . Asthma Sister     Social History Social History   Tobacco Use  . Smoking status: Never Smoker  . Smokeless tobacco: Never Used  Substance Use Topics  . Alcohol use: No  . 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   Review of Systems As per HPI   Physical Exam Triage Vital Signs ED Triage Vitals  Enc Vitals Group     BP      Pulse      Resp      Temp      Temp src      SpO2      Weight      Height      Head Circumference      Peak Flow      Pain Score      Pain Loc      Pain Edu?      Excl. in [place]?    No data found.  Updated Vital Signs BP 113/79 (BP Location: Left Arm)   Pulse 65   Temp 98.4 F (36.9 C) (Oral)   Resp 16   SpO2 97%   Visual Acuity Right Eye Distance:   Left Eye Distance:   Bilateral Distance:    Right  Eye Near:   Left Eye Near:    Bilateral Near:     Physical Exam Constitutional:      General: He is not in acute distress. HENT:     Head: Normocephalic and atraumatic.  Eyes:     General: No scleral icterus.    Pupils: Pupils are equal, round, and reactive to light.  Cardiovascular:     Rate and Rhythm: Normal rate.  Pulmonary:     Effort: Pulmonary effort is normal. No respiratory distress.     Breath sounds: No wheezing.  Musculoskeletal:     Comments: Slightly decreased flexion of lumbar spine second to pain.  No obvious swelling of right lumbar as compared to left.  Patient does have diffuse tenderness throughout right lumbar that spares spinous process, PSIS.  Positive SLR of right.  Skin:    Coloration: Skin is not jaundiced or pale.  Neurological:     Mental Status: He is alert and oriented to person, place, and time.      UC Treatments / Results  Labs (all labs ordered are listed, but only abnormal results are displayed) Labs Reviewed - No data to  display  EKG   Radiology No results found.  Procedures Procedures (including critical care time)  Medications Ordered in UC Medications  methylPREDNISolone sodium succinate (SOLU-MEDROL) 125 mg/2 mL injection 80 mg (80 mg Intramuscular Given 09/03/19 1018)    Initial Impression / Assessment and Plan / UC Course  I have reviewed the triage vital signs and the nursing notes.  Pertinent labs & imaging results that were available during my care of the patient were reviewed by me and considered in my medical decision making (see chart for details).     Patient afebrile, nontoxic in office today.  No red flags such as fever, saddle or anesthesia, urinary retention or fecal incontinence.  H&P consistent with chronic right low back pain with sciatica.  Patient agreeable to trialing Solu-Medrol injection today: Tolerated well.  Will refill Robaxin, encourage patient to keep sports medicine appointment.  Work note provided.  Return precautions discussed, patient verbalized understanding and is agreeable to plan. Final Clinical Impressions(s) / UC Diagnoses   Final diagnoses:  Chronic right-sided low back pain with right-sided sciatica     Discharge Instructions     Heat therapy (hot compress, warm wash rag, hot showers, etc.) can help relax muscles and soothe muscle aches. Cold therapy (ice packs) can be used to help swelling both after injury and after prolonged use of areas of chronic pain/aches.  For pain: recommend you continue taking meloxicam as written by your family medicine doctor  May take muscle relaxer as needed for severe pain / spasm.  (This medication may cause you to become tired so it is important you do not drink alcohol or operate heavy machinery while on this medication.  Recommend your first dose to be taken before bedtime to monitor for side effects safely)    ED Prescriptions    Medication Sig Dispense Auth. Provider   methocarbamol (ROBAXIN) 500 MG tablet Take  1 tablet (500 mg total) by mouth 2 (two) times daily. 20 tablet Hall-Potvin, [place], PA-C     I have reviewed the PDMP during this encounter.   Hall-Potvin, [place], [place] 09/03/19 1020

## 2019-09-03 NOTE — ED Triage Notes (Signed)
Pt c/o lower back pain radiating down rt leg x3wks. States has been treated with mobic for sciatica pain with no relief.

## 2019-09-03 NOTE — Discharge Instructions (Signed)
Heat therapy (hot compress, warm wash rag, hot showers, etc.) can help relax muscles and soothe muscle aches. Cold therapy (ice packs) can be used to help swelling both after injury and after prolonged use of areas of chronic pain/aches.  For pain: recommend you continue taking meloxicam as written by your family medicine doctor  May take muscle relaxer as needed for severe pain / spasm.  (This medication may cause you to become tired so it is important you do not drink alcohol or operate heavy machinery while on this medication.  Recommend your first dose to be taken before bedtime to monitor for side effects safely)

## 2019-09-07 ENCOUNTER — Ambulatory Visit: Payer: Self-pay | Admitting: Family Medicine

## 2019-09-14 ENCOUNTER — Other Ambulatory Visit: Payer: Self-pay

## 2019-09-14 ENCOUNTER — Encounter: Payer: Self-pay | Admitting: Family Medicine

## 2019-09-14 ENCOUNTER — Ambulatory Visit (INDEPENDENT_AMBULATORY_CARE_PROVIDER_SITE_OTHER): Payer: 59 | Admitting: Family Medicine

## 2019-09-14 VITALS — BP 120/70 | Ht 70.0 in | Wt 145.0 lb

## 2019-09-14 DIAGNOSIS — M5441 Lumbago with sciatica, right side: Secondary | ICD-10-CM

## 2019-09-14 DIAGNOSIS — G8929 Other chronic pain: Secondary | ICD-10-CM | POA: Insufficient documentation

## 2019-09-14 MED ORDER — DICLOFENAC SODIUM 75 MG PO TBEC: 75.0000 mg | DELAYED_RELEASE_TABLET | Freq: Two times a day (BID) | ORAL | 1 refills | Status: AC

## 2019-09-14 NOTE — Progress Notes (Addendum)
    SUBJECTIVE:   CHIEF COMPLAINT / HPI:   Right-sided lower back pain, chronic, worse Patient presents with worsening back pain over 1 month.  He has had back pain for 2 years.  Initially injured at work while moving boxes.  Had followed up with [facility] 2 years ago where he got Toradol shots.  Also tried some physical therapy and steroids.  He has seen his primary care in urgent care for the past few months who have trialed meloxicam and Robaxin for pain.  This does help but patient still has significant pain with flares.  Pain ranges from 5-8.  He has to walk with a cane.  He has pain that radiates down the right side of his leg.  Describes it as pins-and-needles.  He says that he has had imaging in the past before.  Says he had a "bulging disc".  He has not had any back surgery.  He does not know what disc was bulging.  Patient reports that his father had significant disc disease in his 30s as well.  His father had to have back surgery including disc replacement in the 90s.  No bowel/bladder dysfunction.  PERTINENT  PMH / PSH:  Noncontributory  OBJECTIVE:   BP 120/70   Ht 5\' 10"  (1.778 m)   Wt 145 lb (65.8 kg)   BMI 20.81 kg/m    Lower back Inspection: No bruises or effusion or misalignment Palpation: Tender on the right lower back ROM: Patient has full range motion with flexion, but does cause significant pain Strength: 5 of 5 strength in lower extremities bilaterally throughout, normal patellar and Achilles reflexes Stability: Stable Special tests: negative FABER and FADIR Vascular studies: NVI   ASSESSMENT/PLAN:   Chronic right-sided low back pain with right-sided sciatica Worsening lower back pain.  Patient has tried meloxicam and Robaxin with minimal improvement. -MRI lumbar spine -Discontinue meloxicam -Trial of Voltaren p.o. -Home exercises   , MD [facility]

## 2019-09-14 NOTE — Assessment & Plan Note (Signed)
Worsening lower back pain.  Patient has tried meloxicam and Robaxin with minimal improvement. -MRI lumbar spine -ROI from [facility] -Discontinue meloxicam -Trial of Voltaren p.o. -Home exercises -If no improvement, consider formal physical therapy, referral back to go for orthopedics for possible steroid injection

## 2019-09-14 NOTE — Patient Instructions (Signed)
You have lumbar radiculopathy (a pinched nerve in your low back). Take tylenol for baseline pain relief (1-2 extra strength tabs 3x/day) Start diclofenac 75mg  twice a day with food for pain and inflammation. Stop the meloxicam. Robaxin as needed for muscle spasms (no driving on this medicine if it makes you sleepy). Stay as active as possible. Physical therapy has been shown to be helpful as well. Strengthening of low back muscles, abdominal musculature are key for long term pain relief. We will go ahead with an MRI of your lumbar spine given this has persisted despite extensive conservative treatment. Follow up with me after the MRI for a no charge visit to go over results and next steps.

## 2019-09-28 ENCOUNTER — Ambulatory Visit: Payer: 59

## 2019-10-05 ENCOUNTER — Other Ambulatory Visit: Payer: Self-pay

## 2019-10-05 ENCOUNTER — Ambulatory Visit
Admission: RE | Admit: 2019-10-05 | Discharge: 2019-10-05 | Disposition: A | Discharge status: Home / Self Care | Payer: 59 | Source: Ambulatory Visit | Attending: Family Medicine | Admitting: Family Medicine

## 2019-10-05 DIAGNOSIS — M5441 Lumbago with sciatica, right side: Secondary | ICD-10-CM

## 2019-10-07 ENCOUNTER — Ambulatory Visit: Payer: 59 | Admitting: Family Medicine

## 2019-10-14 ENCOUNTER — Other Ambulatory Visit: Payer: Self-pay

## 2019-10-14 ENCOUNTER — Other Ambulatory Visit: Payer: Self-pay | Admitting: Family Medicine

## 2019-10-14 ENCOUNTER — Encounter: Payer: Self-pay | Admitting: Family Medicine

## 2019-10-14 ENCOUNTER — Ambulatory Visit (INDEPENDENT_AMBULATORY_CARE_PROVIDER_SITE_OTHER): Payer: 59 | Admitting: Family Medicine

## 2019-10-14 VITALS — BP 120/78 | Ht 70.5 in | Wt 145.0 lb

## 2019-10-14 DIAGNOSIS — G8929 Other chronic pain: Secondary | ICD-10-CM

## 2019-10-14 DIAGNOSIS — M5441 Lumbago with sciatica, right side: Secondary | ICD-10-CM

## 2019-10-14 NOTE — Progress Notes (Signed)
MRI reviewed and discussed with patient.  He notes only mild improvement since last visit.  Some degenerative disc disease at L5-S1 with osteophytes, minor foraminal stenosis.  Similar to MRI from 2 years ago.  Symptoms radiating down right lower extremity persist.  Discussed PT (he has done previously and feels it exacerbated pain), ESI (he would like to go ahead with this), NCV/EMGs.  Will let us know how he's doing a week after ESI.

## 2019-10-14 NOTE — Patient Instructions (Signed)
We will go ahead with an epidural injection into your back (right L5-S1 level). Call me a week after this to let me know how you're doing. We can repeat up to 3 each spaced 2 weeks apart if necessary. We also discussed physical therapy, nerve conduction studies of your right lower extremity.

## 2019-10-26 ENCOUNTER — Other Ambulatory Visit: Payer: Self-pay

## 2019-10-26 ENCOUNTER — Ambulatory Visit
Admission: RE | Admit: 2019-10-26 | Discharge: 2019-10-26 | Disposition: A | Discharge status: Home / Self Care | Payer: 59 | Source: Ambulatory Visit | Attending: Family Medicine | Admitting: Family Medicine

## 2019-10-26 DIAGNOSIS — G8929 Other chronic pain: Secondary | ICD-10-CM

## 2019-10-26 MED ORDER — METHYLPREDNISOLONE ACETATE 40 MG/ML INJ SUSP (RADIOLOG
120.0000 mg | Freq: Once | INTRAMUSCULAR | Status: AC
  Administered 2019-10-26: 120 mg via EPIDURAL

## 2019-10-26 MED ORDER — IOPAMIDOL (ISOVUE-M 200) INJECTION 41%
1.0000 mL | Freq: Once | INTRAMUSCULAR | Status: AC
  Administered 2019-10-26: 1 mL via EPIDURAL

## 2019-10-26 NOTE — Discharge Instructions (Signed)

## 2020-11-15 ENCOUNTER — Encounter: Payer: Self-pay | Admitting: *Deleted

## 2021-09-20 IMAGING — MR MR LUMBAR SPINE W/O CM
5 series · 45 of 48 positions shown · non-contrast
Comparison: 0524

CLINICAL DATA: Low back pain radiating into right leg

EXAM:
MRI LUMBAR SPINE WITHOUT CONTRAST
TECHNIQUE: Multiplanar, multisequence MR imaging of the lumbar spine was
performed. No intravenous contrast was administered.

[Series 3: T2 post-contrast · sagittal · 4.0mm · 0.88mm/px · 6 of 14 slices shown]
[im 1/14]
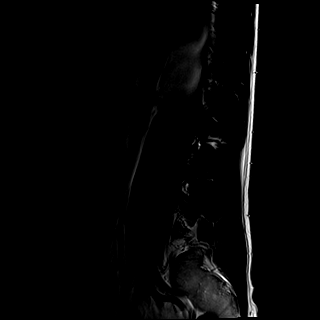
[im 3/14]
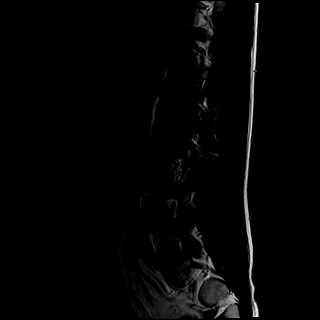
[im 6/14]
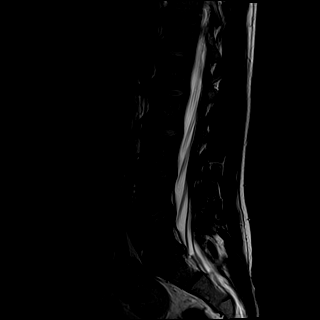
[im 8/14]
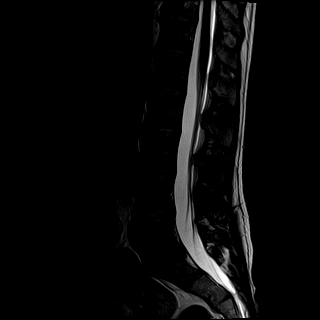
[im 11/14]
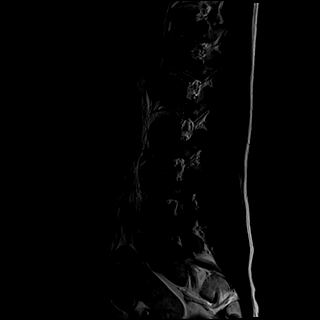
[im 14/14]
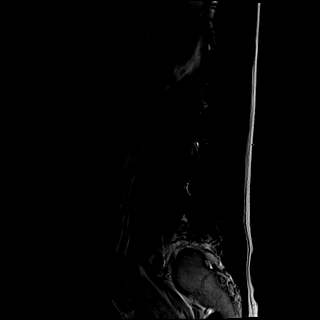

[Series 4: T1 · sagittal · 4.0mm · 0.88mm/px · 6 of 14 slices shown (1 of 2)]
[im 1/14]
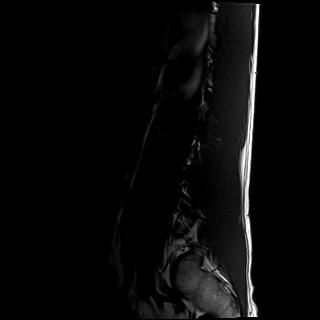
[im 3/14]
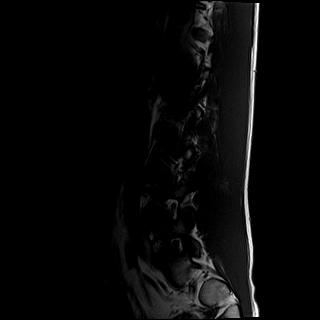
[im 6/14]
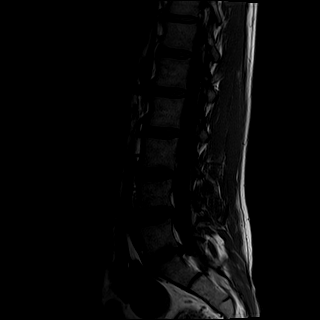
[im 8/14]
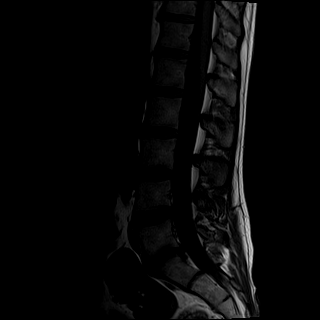
[im 11/14]
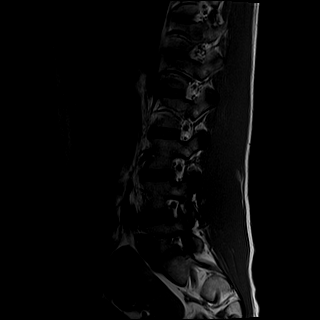
[im 14/14]
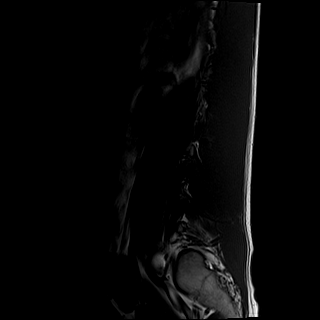

[Series 5: tirm sag · sagittal · 4.0mm · 0.55mm/px · 6 of 14 slices shown]
[im 1/14]
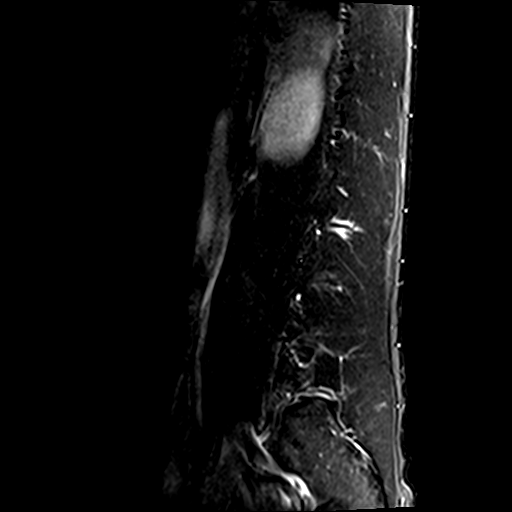
[im 3/14]
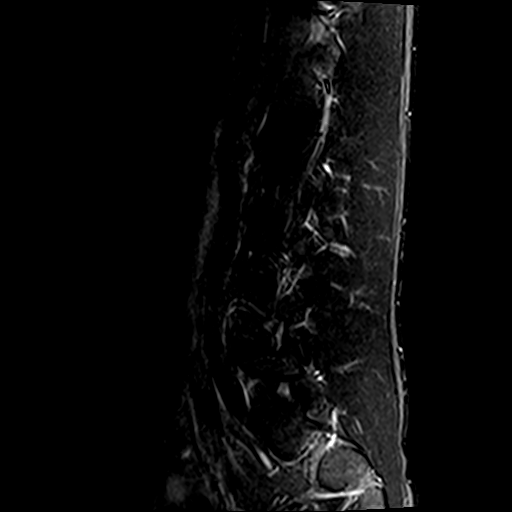
[im 6/14]
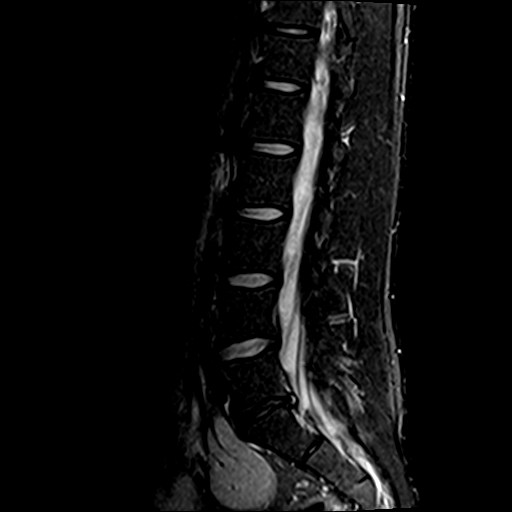
[im 8/14]
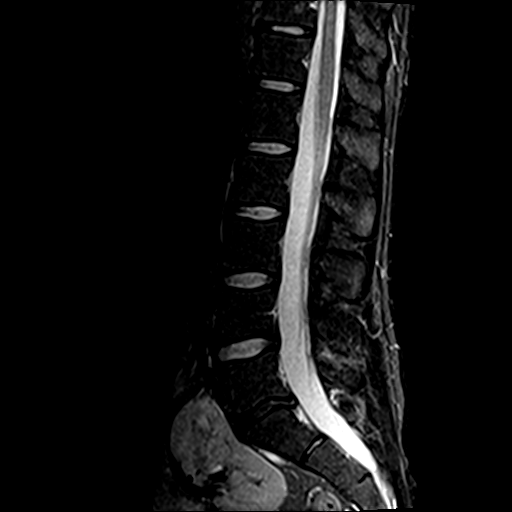
[im 11/14]
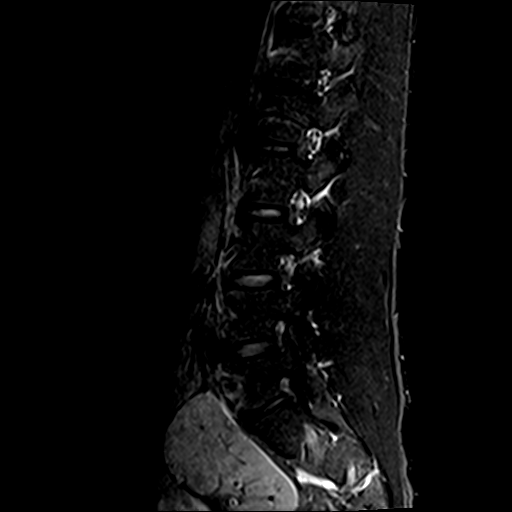
[im 14/14]
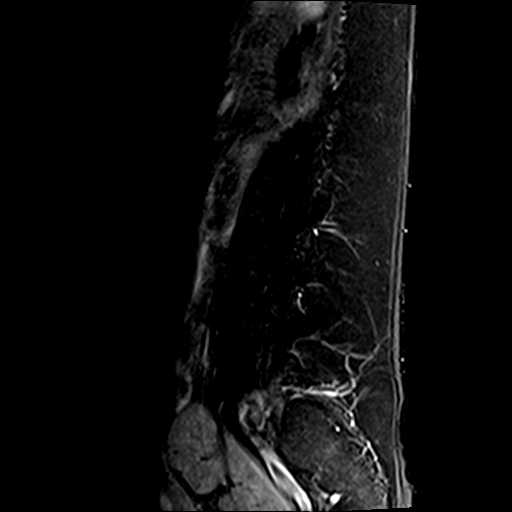

[Series 6: T1 · axial · 4.0mm · 0.78mm/px · z∈[-77,+137]mm · 12 of 35 slices shown (2 of 2)]
[im 1/35]
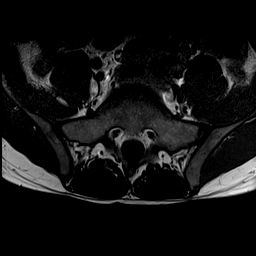
[im 3/35]
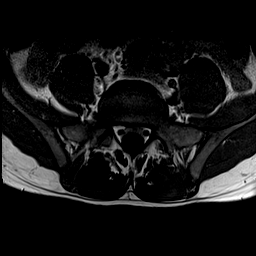
[im 5/35]
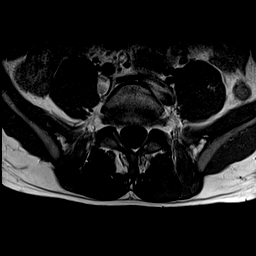
[im 8/35]
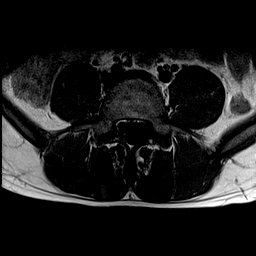
[im 10/35]
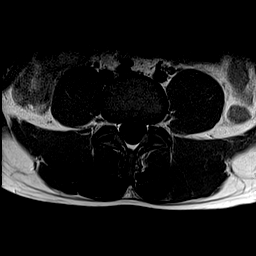
[im 13/35]
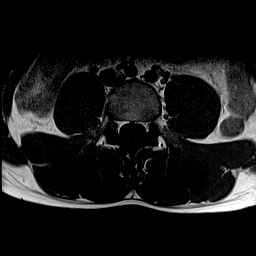
[im 15/35]
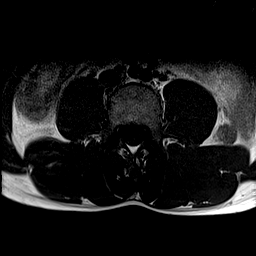
[im 18/35]
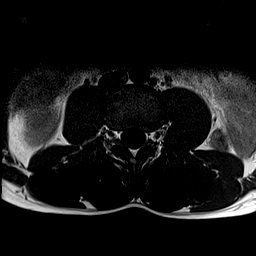
[im 20/35]
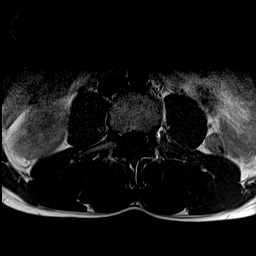
[im 25/35]
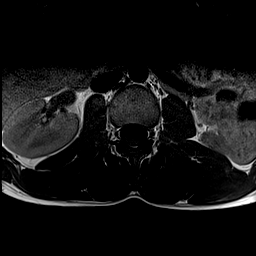
[im 30/35]
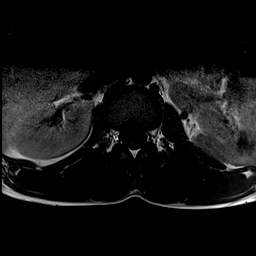
[im 35/35]
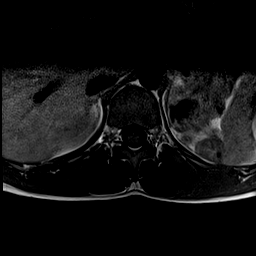

[Series 7: T2 · axial · 4.0mm · 0.78mm/px · z∈[-77,+137]mm · 15 of 35 slices shown]
[im 1/35]
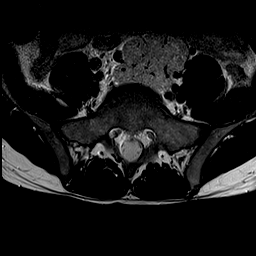
[im 3/35]
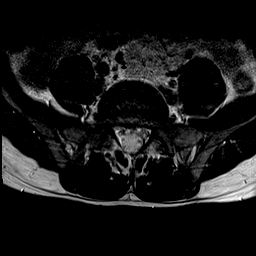
[im 5/35]
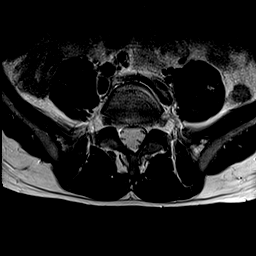
[im 8/35]
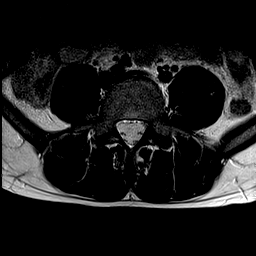
[im 10/35]
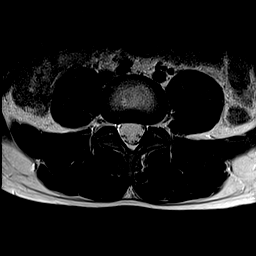
[im 13/35]
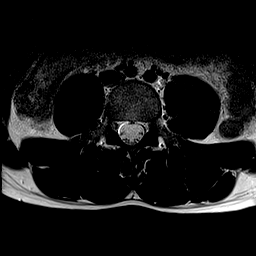
[im 15/35]
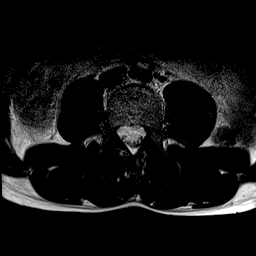
[im 18/35]
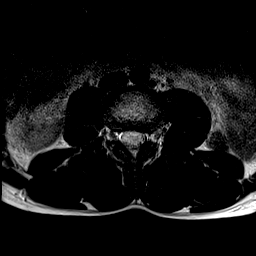
[im 20/35]
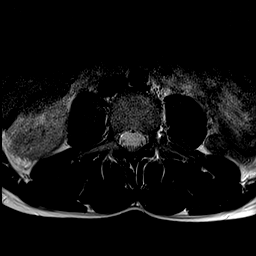
[im 22/35]
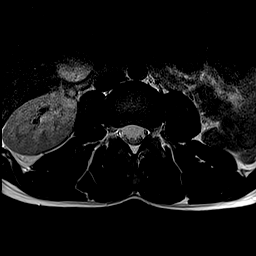
[im 25/35]
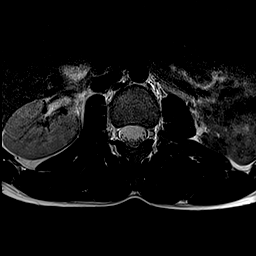
[im 27/35]
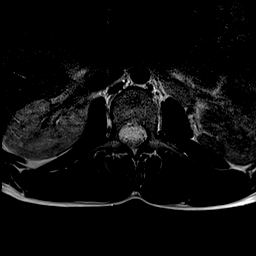
[im 30/35]
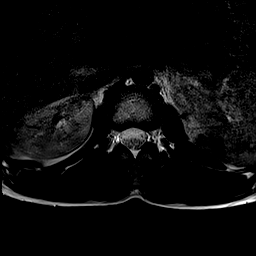
[im 32/35]
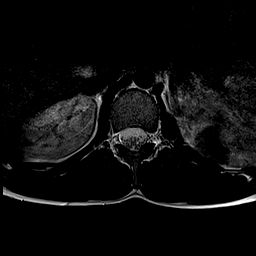
[im 35/35]
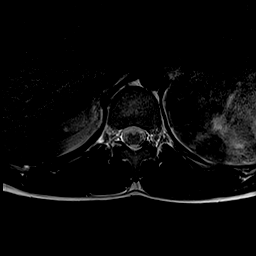

[45 of 48 positions shown; findings below may reference images not displayed]

FINDINGS: Segmentation:  Standard.

Alignment:  Physiologic.

Vertebrae: Vertebral body heights are preserved. There is no edema
or suspicious osseous lesion.

Conus medullaris and cauda equina: Conus extends to the L1-L2 level.
Conus and cauda equina appear normal.

Paraspinal and other soft tissues: Partially imaged left pelvic
kidney.

Disc levels: Intervertebral disc heights and signal are maintained
from L1-L2 through L4-L5 with no disc herniation or significant
stenosis these levels.

L5-S1: Disc desiccation and disc height loss. Disc bulge with
endplate osteophytic ridging and posterior annular fissure eccentric
to the right. No canal stenosis. Minor foraminal stenosis.
Appearance is similar.
IMPRESSION: Degenerative disc disease at L5-S1 similar appearance to the prior
study without evidence of nerve root compression.
